# Patient Record
Sex: Female | Born: 1965 | Race: White | Hispanic: No | Marital: Married | Smoking: Never smoker
Health system: Southern US, Community
[De-identification: ages and names within clinical notes are randomized; demographics above are authoritative.]

## PROBLEM LIST (undated history)

## (undated) DIAGNOSIS — K635 Polyp of colon: Secondary | ICD-10-CM

## (undated) DIAGNOSIS — F329 Major depressive disorder, single episode, unspecified: Secondary | ICD-10-CM

## (undated) DIAGNOSIS — K279 Peptic ulcer, site unspecified, unspecified as acute or chronic, without hemorrhage or perforation: Secondary | ICD-10-CM

## (undated) DIAGNOSIS — F32A Depression, unspecified: Secondary | ICD-10-CM

## (undated) HISTORY — DX: Major depressive disorder, single episode, unspecified: F32.9

## (undated) HISTORY — PX: COLONOSCOPY WITH ESOPHAGOGASTRODUODENOSCOPY (EGD): SHX5779

## (undated) HISTORY — DX: Depression, unspecified: F32.A

## (undated) HISTORY — DX: Peptic ulcer, site unspecified, unspecified as acute or chronic, without hemorrhage or perforation: K27.9

---

## 1998-03-01 HISTORY — PX: TUBAL LIGATION: SHX77

## 1998-04-07 ENCOUNTER — Ambulatory Visit (HOSPITAL_COMMUNITY): Admission: RE | Admit: 1998-04-07 | Discharge: 1998-04-07 | Payer: Self-pay | Admitting: Obstetrics and Gynecology

## 1998-07-01 ENCOUNTER — Encounter: Admission: RE | Admit: 1998-07-01 | Discharge: 1998-09-29 | Payer: Self-pay | Admitting: Obstetrics and Gynecology

## 1998-07-22 ENCOUNTER — Encounter: Payer: Self-pay | Admitting: Obstetrics and Gynecology

## 1998-07-22 ENCOUNTER — Ambulatory Visit (HOSPITAL_COMMUNITY): Admission: RE | Admit: 1998-07-22 | Discharge: 1998-07-22 | Payer: Self-pay | Admitting: Obstetrics and Gynecology

## 1998-08-12 ENCOUNTER — Encounter (HOSPITAL_COMMUNITY): Admission: RE | Admit: 1998-08-12 | Discharge: 1998-09-01 | Payer: Self-pay | Admitting: Obstetrics and Gynecology

## 1998-08-28 ENCOUNTER — Inpatient Hospital Stay (HOSPITAL_COMMUNITY): Admission: AD | Admit: 1998-08-28 | Discharge: 1998-08-28 | Payer: Self-pay | Admitting: Obstetrics and Gynecology

## 1998-08-31 ENCOUNTER — Inpatient Hospital Stay (HOSPITAL_COMMUNITY): Admission: AD | Admit: 1998-08-31 | Discharge: 1998-09-02 | Payer: Self-pay | Admitting: Obstetrics and Gynecology

## 1998-09-01 ENCOUNTER — Encounter (INDEPENDENT_AMBULATORY_CARE_PROVIDER_SITE_OTHER): Payer: Self-pay | Admitting: *Deleted

## 2000-06-13 ENCOUNTER — Other Ambulatory Visit: Admission: RE | Admit: 2000-06-13 | Discharge: 2000-06-13 | Payer: Self-pay | Admitting: Obstetrics and Gynecology

## 2005-06-01 ENCOUNTER — Encounter: Payer: Self-pay | Admitting: Family Medicine

## 2005-06-01 ENCOUNTER — Other Ambulatory Visit: Admission: RE | Admit: 2005-06-01 | Discharge: 2005-06-01 | Payer: Self-pay | Admitting: Family Medicine

## 2005-06-01 ENCOUNTER — Ambulatory Visit: Payer: Self-pay | Admitting: Family Medicine

## 2007-02-06 ENCOUNTER — Encounter: Payer: Self-pay | Admitting: Family Medicine

## 2007-02-06 ENCOUNTER — Encounter: Payer: Self-pay | Admitting: Gastroenterology

## 2007-02-06 ENCOUNTER — Ambulatory Visit: Payer: Self-pay | Admitting: Gastroenterology

## 2007-02-13 ENCOUNTER — Ambulatory Visit: Payer: Self-pay | Admitting: Gastroenterology

## 2007-03-16 ENCOUNTER — Ambulatory Visit: Payer: Self-pay | Admitting: Gastroenterology

## 2007-03-16 ENCOUNTER — Encounter: Payer: Self-pay | Admitting: Family Medicine

## 2007-04-12 ENCOUNTER — Ambulatory Visit: Payer: Self-pay | Admitting: Internal Medicine

## 2007-04-12 ENCOUNTER — Encounter: Payer: Self-pay | Admitting: Gastroenterology

## 2007-04-12 ENCOUNTER — Encounter: Payer: Self-pay | Admitting: Family Medicine

## 2007-04-26 ENCOUNTER — Ambulatory Visit: Payer: Self-pay | Admitting: Internal Medicine

## 2007-08-17 ENCOUNTER — Telehealth: Payer: Self-pay | Admitting: Internal Medicine

## 2007-10-11 ENCOUNTER — Encounter: Payer: Self-pay | Admitting: Family Medicine

## 2007-10-11 ENCOUNTER — Ambulatory Visit: Payer: Self-pay | Admitting: Family Medicine

## 2007-10-11 ENCOUNTER — Encounter (INDEPENDENT_AMBULATORY_CARE_PROVIDER_SITE_OTHER): Payer: Self-pay | Admitting: *Deleted

## 2007-10-11 ENCOUNTER — Other Ambulatory Visit: Admission: RE | Admit: 2007-10-11 | Discharge: 2007-10-11 | Payer: Self-pay | Admitting: Family Medicine

## 2007-10-11 DIAGNOSIS — K279 Peptic ulcer, site unspecified, unspecified as acute or chronic, without hemorrhage or perforation: Secondary | ICD-10-CM | POA: Insufficient documentation

## 2007-10-16 ENCOUNTER — Encounter (INDEPENDENT_AMBULATORY_CARE_PROVIDER_SITE_OTHER): Payer: Self-pay | Admitting: *Deleted

## 2007-10-17 ENCOUNTER — Ambulatory Visit (HOSPITAL_COMMUNITY): Admission: RE | Admit: 2007-10-17 | Discharge: 2007-10-17 | Payer: Self-pay | Admitting: Family Medicine

## 2007-10-19 ENCOUNTER — Telehealth (INDEPENDENT_AMBULATORY_CARE_PROVIDER_SITE_OTHER): Payer: Self-pay | Admitting: *Deleted

## 2007-10-23 ENCOUNTER — Encounter (INDEPENDENT_AMBULATORY_CARE_PROVIDER_SITE_OTHER): Payer: Self-pay | Admitting: *Deleted

## 2007-10-24 ENCOUNTER — Encounter (INDEPENDENT_AMBULATORY_CARE_PROVIDER_SITE_OTHER): Payer: Self-pay | Admitting: *Deleted

## 2008-01-23 ENCOUNTER — Ambulatory Visit: Payer: Self-pay | Admitting: Family Medicine

## 2008-02-17 LAB — CONVERTED CEMR LAB
ALT: 19 units/L (ref 0–35)
AST: 15 units/L (ref 0–37)
Albumin: 3.6 g/dL (ref 3.5–5.2)
Alkaline Phosphatase: 71 units/L (ref 39–117)
Bilirubin, Direct: 0.1 mg/dL (ref 0.0–0.3)
Cholesterol: 199 mg/dL (ref 0–200)
HDL: 46.8 mg/dL (ref >39.0)
LDL Cholesterol: 134 mg/dL — ABNORMAL HIGH (ref 0–99)
Total Bilirubin: 0.9 mg/dL (ref 0.3–1.2)
Total CHOL/HDL Ratio: 4.3
Total Protein: 7.1 g/dL (ref 6.0–8.3)
Triglycerides: 93 mg/dL (ref 0–149)
VLDL: 19 mg/dL (ref 0–40)

## 2008-02-19 ENCOUNTER — Encounter (INDEPENDENT_AMBULATORY_CARE_PROVIDER_SITE_OTHER): Payer: Self-pay | Admitting: *Deleted

## 2008-04-10 ENCOUNTER — Telehealth: Payer: Self-pay | Admitting: Internal Medicine

## 2008-04-19 ENCOUNTER — Ambulatory Visit: Payer: Self-pay | Admitting: Family Medicine

## 2008-04-19 DIAGNOSIS — F329 Major depressive disorder, single episode, unspecified: Secondary | ICD-10-CM

## 2008-05-17 ENCOUNTER — Ambulatory Visit: Payer: Self-pay | Admitting: Family Medicine

## 2008-06-21 ENCOUNTER — Ambulatory Visit: Payer: Self-pay | Admitting: Family Medicine

## 2008-06-25 ENCOUNTER — Encounter (INDEPENDENT_AMBULATORY_CARE_PROVIDER_SITE_OTHER): Payer: Self-pay | Admitting: *Deleted

## 2008-06-25 LAB — CONVERTED CEMR LAB
ALT: 14 units/L (ref 0–35)
AST: 12 units/L (ref 0–37)
Albumin: 3.6 g/dL (ref 3.5–5.2)
Alkaline Phosphatase: 76 units/L (ref 39–117)
Bilirubin, Direct: 0.1 mg/dL (ref 0.0–0.3)
Cholesterol: 192 mg/dL (ref 0–200)
HDL: 44.4 mg/dL (ref >39.00)
LDL Cholesterol: 132 mg/dL — ABNORMAL HIGH (ref 0–99)
Total Bilirubin: 0.9 mg/dL (ref 0.3–1.2)
Total CHOL/HDL Ratio: 4
Total Protein: 6.9 g/dL (ref 6.0–8.3)
Triglycerides: 79 mg/dL (ref 0.0–149.0)
VLDL: 15.8 mg/dL (ref 0.0–40.0)

## 2008-08-19 ENCOUNTER — Telehealth (INDEPENDENT_AMBULATORY_CARE_PROVIDER_SITE_OTHER): Payer: Self-pay | Admitting: *Deleted

## 2009-05-08 ENCOUNTER — Encounter (INDEPENDENT_AMBULATORY_CARE_PROVIDER_SITE_OTHER): Payer: Self-pay | Admitting: *Deleted

## 2009-05-08 ENCOUNTER — Ambulatory Visit: Payer: Self-pay | Admitting: Family Medicine

## 2009-05-09 ENCOUNTER — Encounter (INDEPENDENT_AMBULATORY_CARE_PROVIDER_SITE_OTHER): Payer: Self-pay | Admitting: *Deleted

## 2009-05-19 ENCOUNTER — Telehealth (INDEPENDENT_AMBULATORY_CARE_PROVIDER_SITE_OTHER): Payer: Self-pay | Admitting: *Deleted

## 2009-05-20 ENCOUNTER — Other Ambulatory Visit: Admission: RE | Admit: 2009-05-20 | Discharge: 2009-05-20 | Payer: Self-pay | Admitting: Family Medicine

## 2009-05-20 ENCOUNTER — Ambulatory Visit: Payer: Self-pay | Admitting: Family Medicine

## 2009-05-20 ENCOUNTER — Ambulatory Visit (HOSPITAL_COMMUNITY): Admission: RE | Admit: 2009-05-20 | Discharge: 2009-05-20 | Payer: Self-pay | Admitting: Family Medicine

## 2009-05-23 ENCOUNTER — Ambulatory Visit: Payer: Self-pay | Admitting: Family Medicine

## 2009-05-27 ENCOUNTER — Encounter (INDEPENDENT_AMBULATORY_CARE_PROVIDER_SITE_OTHER): Payer: Self-pay | Admitting: *Deleted

## 2009-05-27 LAB — CONVERTED CEMR LAB: Fecal Occult Bld: NEGATIVE

## 2009-05-29 ENCOUNTER — Telehealth (INDEPENDENT_AMBULATORY_CARE_PROVIDER_SITE_OTHER): Payer: Self-pay | Admitting: *Deleted

## 2009-05-29 DIAGNOSIS — R87620 Atypical squamous cells of undetermined significance on cytologic smear of vagina (ASC-US): Secondary | ICD-10-CM | POA: Insufficient documentation

## 2009-05-29 LAB — CONVERTED CEMR LAB: Pap Smear: UNDETERMINED

## 2009-06-06 ENCOUNTER — Encounter: Payer: Self-pay | Admitting: Family Medicine

## 2009-07-15 ENCOUNTER — Telehealth (INDEPENDENT_AMBULATORY_CARE_PROVIDER_SITE_OTHER): Payer: Self-pay | Admitting: *Deleted

## 2009-07-25 ENCOUNTER — Telehealth (INDEPENDENT_AMBULATORY_CARE_PROVIDER_SITE_OTHER): Payer: Self-pay | Admitting: *Deleted

## 2009-12-03 ENCOUNTER — Ambulatory Visit: Payer: Self-pay | Admitting: Family Medicine

## 2009-12-03 DIAGNOSIS — R5383 Other fatigue: Secondary | ICD-10-CM

## 2009-12-03 DIAGNOSIS — R5381 Other malaise: Secondary | ICD-10-CM

## 2009-12-03 DIAGNOSIS — D649 Anemia, unspecified: Secondary | ICD-10-CM

## 2009-12-08 LAB — CONVERTED CEMR LAB
ALT: 17 units/L (ref 0–35)
AST: 15 units/L (ref 0–37)
BUN: 13 mg/dL (ref 6–23)
Basophils Relative: 0.7 % (ref 0.0–3.0)
Bilirubin, Direct: 0.1 mg/dL (ref 0.0–0.3)
Creatinine, Ser: 0.8 mg/dL (ref 0.4–1.2)
Eosinophils Relative: 4.3 % (ref 0.0–5.0)
GFR calc non Af Amer: 80.72 mL/min (ref >60)
HCT: 33.8 % — ABNORMAL LOW (ref 36.0–46.0)
Iron: 106 ug/dL (ref 42–145)
Monocytes Relative: 8.7 % (ref 3.0–12.0)
Neutrophils Relative %: 54.9 % (ref 43.0–77.0)
Platelets: 249 10*3/uL (ref 150.0–400.0)
Potassium: 4.5 meq/L (ref 3.5–5.1)
RBC: 3.96 M/uL (ref 3.87–5.11)
Saturation Ratios: 21.3 % (ref 20.0–50.0)
Total Bilirubin: 0.8 mg/dL (ref 0.3–1.2)
Transferrin: 356.2 mg/dL (ref 212.0–360.0)
WBC: 4.9 10*3/uL (ref 4.5–10.5)

## 2010-03-29 LAB — CONVERTED CEMR LAB
ALT: 17 units/L (ref 0–35)
ALT: 19 units/L (ref 0–35)
AST: 14 units/L (ref 0–37)
AST: 15 units/L (ref 0–37)
Albumin: 3.6 g/dL (ref 3.5–5.2)
Albumin: 3.7 g/dL (ref 3.5–5.2)
Alkaline Phosphatase: 70 units/L (ref 39–117)
Alkaline Phosphatase: 73 units/L (ref 39–117)
BUN: 10 mg/dL (ref 6–23)
BUN: 9 mg/dL (ref 6–23)
Basophils Absolute: 0 10*3/uL (ref 0.0–0.1)
Basophils Absolute: 0 10*3/uL (ref 0.0–0.1)
Basophils Relative: 0.8 % (ref 0.0–3.0)
Basophils Relative: 0.9 % (ref 0.0–3.0)
Bilirubin, Direct: 0.1 mg/dL (ref 0.0–0.3)
Bilirubin, Direct: 0.1 mg/dL (ref 0.0–0.3)
CO2: 28 meq/L (ref 19–32)
CO2: 28 meq/L (ref 19–32)
Calcium: 9.1 mg/dL (ref 8.4–10.5)
Calcium: 9.3 mg/dL (ref 8.4–10.5)
Chloride: 105 meq/L (ref 96–112)
Chloride: 110 meq/L (ref 96–112)
Cholesterol: 141 mg/dL (ref 0–200)
Cholesterol: 203 mg/dL (ref 0–200)
Creatinine, Ser: 0.8 mg/dL (ref 0.4–1.2)
Creatinine, Ser: 0.8 mg/dL (ref 0.4–1.2)
Direct LDL: 141.2 mg/dL
Eosinophils Absolute: 0.1 10*3/uL (ref 0.0–0.7)
Eosinophils Absolute: 0.2 10*3/uL (ref 0.0–0.7)
Eosinophils Relative: 2.5 % (ref 0.0–5.0)
Eosinophils Relative: 5 % (ref 0.0–5.0)
GFR calc Af Amer: 100 mL/min
GFR calc non Af Amer: 82.09 mL/min (ref >60)
GFR calc non Af Amer: 83 mL/min
Glucose, Bld: 90 mg/dL (ref 70–99)
Glucose, Bld: 90 mg/dL (ref 70–99)
HCT: 32.3 % — ABNORMAL LOW (ref 36.0–46.0)
HCT: 33 % — ABNORMAL LOW (ref 36.0–46.0)
HDL: 38.4 mg/dL — ABNORMAL LOW (ref >39.0)
HDL: 42.8 mg/dL (ref >39.00)
Hemoglobin: 10.7 g/dL — ABNORMAL LOW (ref 12.0–15.0)
Hemoglobin: 11.7 g/dL — ABNORMAL LOW (ref 12.0–15.0)
LDL Cholesterol: 84 mg/dL (ref 0–99)
Lymphocytes Relative: 23.7 % (ref 12.0–46.0)
Lymphocytes Relative: 30.1 % (ref 12.0–46.0)
Lymphs Abs: 1.3 10*3/uL (ref 0.7–4.0)
MCHC: 33 g/dL (ref 30.0–36.0)
MCHC: 35.5 g/dL (ref 30.0–36.0)
MCV: 83 fL (ref 78.0–100.0)
MCV: 86.7 fL (ref 78.0–100.0)
Monocytes Absolute: 0.3 10*3/uL (ref 0.1–1.0)
Monocytes Absolute: 0.4 10*3/uL (ref 0.1–1.0)
Monocytes Relative: 6.7 % (ref 3.0–12.0)
Monocytes Relative: 6.8 % (ref 3.0–12.0)
Neutro Abs: 2.8 10*3/uL (ref 1.4–7.7)
Neutro Abs: 3.5 10*3/uL (ref 1.4–7.7)
Neutrophils Relative %: 57.2 % (ref 43.0–77.0)
Neutrophils Relative %: 66.3 % (ref 43.0–77.0)
Platelets: 220 10*3/uL (ref 150–400)
Platelets: 247 10*3/uL (ref 150.0–400.0)
Potassium: 3.8 meq/L (ref 3.5–5.1)
Potassium: 4.1 meq/L (ref 3.5–5.1)
RBC: 3.81 M/uL — ABNORMAL LOW (ref 3.87–5.11)
RBC: 3.89 M/uL (ref 3.87–5.11)
RDW: 12 % (ref 11.5–14.6)
RDW: 14.5 % (ref 11.5–14.6)
Sodium: 139 meq/L (ref 135–145)
Sodium: 142 meq/L (ref 135–145)
TSH: 0.95 microintl units/mL (ref 0.35–5.50)
TSH: 1.06 microintl units/mL (ref 0.35–5.50)
Total Bilirubin: 0.7 mg/dL (ref 0.3–1.2)
Total Bilirubin: 1 mg/dL (ref 0.3–1.2)
Total CHOL/HDL Ratio: 3
Total CHOL/HDL Ratio: 5.3
Total Protein: 6.6 g/dL (ref 6.0–8.3)
Total Protein: 6.9 g/dL (ref 6.0–8.3)
Triglycerides: 72 mg/dL (ref 0.0–149.0)
Triglycerides: 89 mg/dL (ref 0–149)
VLDL: 14.4 mg/dL (ref 0.0–40.0)
VLDL: 18 mg/dL (ref 0–40)
WBC: 4.7 10*3/uL (ref 4.5–10.5)
WBC: 5.3 10*3/uL (ref 4.5–10.5)

## 2010-03-31 NOTE — Assessment & Plan Note (Signed)
Summary: pap only//lch   Vital Signs:  Patient profile:   45 year old female Weight:      181 pounds Pulse rate:   72 / minute Pulse rhythm:   regular BP sitting:   122 / 80  (left arm) Cuff size:   regular  Vitals Entered By: Army Fossa CMA (May 20, 2009 9:54 AM) CC: Pt here for PAP only   Preventive Screening-Counseling & Management  Alcohol-Tobacco     Alcohol drinks/day: <1     Alcohol type: rum     Smoking Status: never  Caffeine-Diet-Exercise     Caffeine use/day: 5+     Caffeine Counseling: decrease use of caffeine     Does Patient Exercise: no     Exercise Counseling: to improve exercise regimen  Current Medications (verified): 1)  Protonix 40 Mg  Tbec (Pantoprazole Sodium) .Marland Kitchen.. 1 Each Day 30 Minutes Before Meal 2)  Wellbutrin Xl 150 Mg Xr24h-Tab (Bupropion Hcl) .... Take 3 By Mouth Once Daily 3)  Slow Fe 160 (50 Fe) Mg Cr-Tabs (Ferrous Sulfate Dried)  Allergies: 1)  ! * Nexium  Past History:  Past medical, surgical, family and social histories (including risk factors) reviewed for relevance to current acute and chronic problems.  Past Medical History: Reviewed history from 04/19/2008 and no changes required. Peptic ulcer disease Depression  Past Surgical History: Reviewed history from 05/08/2009 and no changes required. Tubal ligation (2000)  Family History: Reviewed history from 10/11/2007 and no changes required. MGM--breast Cancer in 39s M--bladder Cancer, pulm fibrosis Family History High cholesterol Family History Hypertension Family History Kidney disease Family History Thyroid disease  Social History: Reviewed history from 05/08/2009 and no changes required. 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 Normal sphincter tone. No rectal masses or tenderness.  heme neg brown stool Genitalia:  Pelvic Exam:        External: normal female genitalia without lesions or masses        Vagina: normal without lesions or masses        Cervix: normal without lesions or masses        Adnexa: normal bimanual exam without masses or fullness        Uterus: normal by palpation        Pap smear: performed Psych:  Oriented X3, normally interactive, and good eye contact.     Impression & Recommendations:  Problem # 1:  ROUTINE GYNECOLOGICAL EXAMINATION (ICD-V72.31) pap done ghm utd rto 1 year  Complete Medication List: 1)  Protonix 40 Mg Tbec (Pantoprazole sodium) .Marland Kitchen.. 1 each day 30 minutes before meal 2)  Wellbutrin Xl 150 Mg Xr24h-tab (Bupropion hcl) .... Take 3 by mouth once daily 3)  Slow Fe 160 (50 Fe) Mg Cr-tabs (Ferrous sulfate dried)

## 2010-03-31 NOTE — Progress Notes (Signed)
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 Army Fossa CMA  May 29, 2009 10:17 AM

## 2010-03-31 NOTE — Consult Note (Signed)
Summary: [facility]  [facility]   Imported By: Lanelle Bal 06/13/2009 09:07:13  _____________________________________________________________________  External Attachment:    Type:   Image     Comment:   External Document

## 2010-03-31 NOTE — Progress Notes (Signed)
Summary: Refill Request  Phone Note Refill Request Message from:  Pharmacy on Jul 15, 2009 9:48 AM  Refills Requested: Medication #1:  PROTONIX 40 MG  TBEC 1 each day 30 minutes before meal   Dosage confirmed as above?Dosage Confirmed   Supply Requested: 1 month [facility]  Next Appointment Scheduled: none Initial call taken by: Harold Barban,  Jul 15, 2009 9:48 AM    Prescriptions: PROTONIX 40 MG  TBEC (PANTOPRAZOLE SODIUM) 1 each day 30 minutes before meal  #30 x 5   Entered by:   Army Fossa CMA   Authorized by:   Loreen Freud DO   Signed by:   Army Fossa CMA on 07/15/2009   Method used:   Electronically to        [facility]* (retail)       [address]       [address] Shipping/mailing       [place], [place]  [address]       Ph: [phone]       Fax: [phone]   RxID:   [id number]

## 2010-03-31 NOTE — Letter (Signed)
Summary: [facility]: Immunoassay Fecal Occult Blood (iFOB) Order Form  [facility]  [address] [place], [place] [address]   Phone: [phone]  Fax: [phone]      [facility]: Immunoassay Fecal Occult Blood (iFOB) Order Form   May 08, 2009 MRN: [id number]   Sandra Barnett March 07, 1965   Physicican Name:___Yvonne Lowne,DO______________________  Diagnosis Code:______V76.51____________________      Army Fossa CMA

## 2010-03-31 NOTE — Letter (Signed)
Summary: Results Follow up Letter  [facility]  [address] [place], [place] [address]   Phone: [phone]  Fax: [phone]    05/27/2009 MRN: [id number]  Sandra Barnett [address] [place], [place]  [address]  Dear [name],  The following are the results of your recent test(s):  Test         Result    Pap Smear:        Normal _____  Not Normal _____ Comments: ______________________________________________________ Cholesterol: LDL(Bad cholesterol):         Your goal is less than:         HDL (Good cholesterol):       Your goal is more than: Comments:  ______________________________________________________ Mammogram:        Normal _____  Not Normal _____ Comments:  ___________________________________________________________________ Hemoccult:        Normal __X___  Not normal _______ Comments:    _____________________________________________________________________ Other Tests:    We routinely do not discuss normal results over the telephone.  If you desire a copy of the results, or you have any questions about this information we can discuss them at your next office visit.   Sincerely,

## 2010-03-31 NOTE — Letter (Signed)
Summary: Primary Care Consult Scheduled Letter  [facility]  [address] [place], [place] [address]   Phone: [phone]  Fax: [phone]      05/09/2009 MRN: [id number]  BAYLE CALVO [address] [place], [place]  [address]    Dear [name],    We have scheduled an appointment for you.  At the recommendation of Dr. Loreen Freud, we have scheduled you for a Screening Mammogram with [facility] on 05-20-2009 arrive by 8:00am.  Their address is [address] [place] [address]. The office phone number is [phone].  If this appointment day and time is not convenient for you, please feel free to call the office of the doctor you are being referred to at the number listed above and reschedule the appointment.    It is important for you to keep your scheduled appointments. We are here to make sure you are given good patient care.   Thank you,    Renee, Patient Care Coordinator [facility]

## 2010-03-31 NOTE — Progress Notes (Signed)
Summary: date start med  Phone Note Refill Request   Refills Requested: Medication #1:  WELLBUTRIN XL 150 MG XR24H-TAB Take 3 by mouth once daily pt left VM that she needs to know the date she started this med. called pt back informed pt of date  Initial call taken by: [facility] CMA,  Jul 25, 2009 2:05 PM

## 2010-03-31 NOTE — Assessment & Plan Note (Signed)
Summary: THYROID PROBLEM??/KN   Vital Signs:  Patient profile:   45 year old female Weight:      187.2 pounds Temp:     98.3 degrees F oral Pulse rate:   64 / minute Pulse rhythm:   regular BP sitting:   130 / 82  (left arm) Cuff size:   regular  Vitals Entered By: Almeta Monas CMA Duncan Dull) (December 03, 2009 10:35 AM) CC: c/o hair loss and feeling tired all of the time, wants to check thyroid   History of Present Illness: Pt here c/o hair loss for 6-8 months.  Clumps come out in shower. Pt is tired all the time.  Pt is requesting tsh to be checked.  Pt does not sleep great.  She states she is not depressed and has been doing well off wellbutrin.  Pt states her cycles are heavier and longer.  Pt states it takes every oz in her to get up and go to gym---she is exhausted.     Preventive Screening-Counseling & Management  Alcohol-Tobacco     Alcohol drinks/day: <1     Alcohol type: rum     Smoking Status: never  Caffeine-Diet-Exercise     Caffeine use/day: 5+     Caffeine Counseling: decrease use of caffeine     Does Patient Exercise: no     Exercise Counseling: to improve exercise regimen  Current Medications (verified): 1)  Protonix 40 Mg  Tbec (Pantoprazole Sodium) .Marland Kitchen.. 1 Each Day 30 Minutes Before Meal  Allergies (verified): 1)  ! * Nexium  Past History:  Past medical, surgical, family and social histories (including risk factors) reviewed for relevance to current acute and chronic problems.  Past Medical History: Reviewed history from 04/19/2008 and no changes required. Peptic ulcer disease Depression  Past Surgical History: Reviewed history from 05/08/2009 and no changes required. Tubal ligation (2000)  Family History: Reviewed history from 10/11/2007 and no changes required. MGM--breast Cancer in 18s M--bladder Cancer, pulm fibrosis Family History High cholesterol Family History Hypertension Family History Kidney disease Family History Thyroid  disease  Social History: Reviewed history from 05/08/2009 and no changes required. 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  Teeth in good repair. Neck:  No deformities, masses, or tenderness noted. Lungs:  Normal respiratory effort, chest expands symmetrically. Lungs are clear to auscultation, no crackles or wheezes. Heart:  Normal rate and regular rhythm. S1 and S2 normal without gallop, murmur, click, rub or other extra sounds. Extremities:  No clubbing, cyanosis, edema, or deformity noted with normal full range of motion of all joints.   Skin:  Intact without suspicious lesions or rashes Cervical Nodes:  No lymphadenopathy noted Psych:  Cognition and judgment appear intact. Alert and cooperative with normal attention span and concentration. No apparent delusions, illusions, hallucinations   Impression & Recommendations:  Problem # 1:  FATIGUE (ICD-780.79)  Orders: Venipuncture ([address]) TLB-B12 + Folate Pnl (60454_09811-B14/NWG) TLB-IBC Pnl (Iron/FE;Transferrin) (83550-IBC) TLB-BMP (Basic Metabolic Panel-BMET) (80048-METABOL) TLB-CBC Platelet - w/Differential (85025-CBCD) TLB-Hepatic/Liver Function Pnl (80076-HEPATIC) TLB-TSH (Thyroid Stimulating Hormone) (84443-TSH) TLB-T3, Free (Triiodothyronine) (84481-T3FREE) TLB-T4 (Thyrox), Free [phone])  Problem # 2:  ANEMIA (ICD-285.9)  The following medications were removed from the medication list:    Slow Fe 160 (50 Fe) Mg  Cr-tabs (Ferrous sulfate dried)  Orders: Venipuncture ([address]) TLB-B12 + Folate Pnl (78295_62130-Q65/HQI) TLB-IBC Pnl (Iron/FE;Transferrin) (83550-IBC) TLB-BMP (Basic Metabolic Panel-BMET)  (80048-METABOL) TLB-CBC Platelet - w/Differential (85025-CBCD) TLB-Hepatic/Liver Function Pnl (80076-HEPATIC) TLB-TSH (Thyroid Stimulating Hormone) (84443-TSH) TLB-T3, Free (Triiodothyronine) (84481-T3FREE) TLB-T4 (Thyrox), Free [phone])  Hgb: 10.7 (05/08/2009)   Hct: 32.3 (05/08/2009)   Platelets: 247.0 (05/08/2009) RBC: 3.89 (05/08/2009)   RDW: 14.5 (05/08/2009)   WBC: 5.3 (05/08/2009) MCV: 83.0 (05/08/2009)   MCHC: 33.0 (05/08/2009) TSH: 1.06 (05/08/2009)  Complete Medication List: 1)  Protonix 40 Mg Tbec (Pantoprazole sodium) .Marland Kitchen.. 1 each day 30 minutes before meal

## 2010-03-31 NOTE — Progress Notes (Signed)
  Phone Note Outgoing Call   Call placed by: Army Fossa CMA,  May 19, 2009 4:39 PM Reason for Call: Discuss lab or test results Summary of Call: Regarding lab results, LMTCB:  Hgb low----take slow fe 1 by mouth once daily  recheck 1 month 285.9  cbcd, ibc panal,  ferritin Signed by Loreen Freud DO on 05/18/2009 at 11:49 AM  Follow-up for Phone Call        Pt is aware. Army Fossa CMA  May 19, 2009 4:46 PM     New/Updated Medications: SLOW FE 160 (50 FE) MG CR-TABS (FERROUS SULFATE DRIED)

## 2010-03-31 NOTE — Assessment & Plan Note (Signed)
Summary: CPX---WILL TRY TO FAST AFTER 6AM///SPH   Vital Signs:  Patient profile:   45 year old female Height:      64 inches Weight:      176 pounds BMI:     30.32 Temp:     98.2 degrees F oral Pulse rate:   68 / minute BP sitting:   118 / 86  (left arm)  Vitals Entered By: Jeremy Johann CMA (May 08, 2009 12:44 PM) CC: CPX. FASTING, NO PAP Comments REVIEWED MED LIST, PATIENT AGREED DOSE AND INSTRUCTION CORRECT    History of Present Illness: Pt here for cpe--pt is on menses so she will rto for pap.  Labs will be done as well.   No complaints.    Preventive Screening-Counseling & Management  Alcohol-Tobacco     Alcohol drinks/day: <1     Smoking Status: never  Caffeine-Diet-Exercise     Caffeine use/day: 5+     Caffeine Counseling: decrease use of caffeine     Does Patient Exercise: no     Exercise Counseling: to improve exercise regimen  Hep-HIV-STD-Contraception     HIV Risk: no     Dental Visit-last 6 months yes     Dental Care Counseling: not indicated; dental care within six months     SBE monthly: no     SBE Education/Counseling: to perform regular SBE  Safety-Violence-Falls     Seat Belt Use: 100      Sexual History:  currently monogamous and separated.    Current Medications (verified): 1)  Protonix 40 Mg  Tbec (Pantoprazole Sodium) .Marland Kitchen.. 1 Each Day 30 Minutes Before Meal 2)  Wellbutrin Xl 150 Mg Xr24h-Tab (Bupropion Hcl) .... Take 3 By Mouth Once Daily  Allergies: 1)  ! 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 Eyes:  Denies blurring, discharge, double vision, eye irritation, eye pain, halos, itching, light sensitivity, red eye, vision loss-1 eye, and vision loss-both eyes; optho-- q1y. ENT:  Denies decreased hearing, difficulty swallowing, ear discharge, earache, hoarseness, nasal congestion, nosebleeds, postnasal drainage, ringing in ears, sinus pressure, and sore throat. CV:  Denies bluish discoloration of lips or nails, chest pain or discomfort, difficulty breathing at night, difficulty breathing while lying down, fainting, fatigue, leg cramps with exertion, lightheadness, near fainting, palpitations, shortness of breath with exertion, swelling of feet, swelling of hands, and weight gain. Resp:  Denies chest discomfort, chest pain with inspiration, cough, coughing up blood, excessive snoring, hypersomnolence, morning headaches, pleuritic, shortness of breath, sputum productive, and wheezing. GI:  Denies abdominal pain, bloody stools, change in bowel habits, constipation, dark tarry stools, diarrhea, excessive appetite, gas, hemorrhoids, indigestion, and loss of appetite.  GU:  Denies abnormal vaginal bleeding, decreased libido, discharge, dysuria, genital sores, hematuria, incontinence, nocturia, urinary frequency, and urinary hesitancy. MS:  Denies joint pain, joint redness, joint swelling, loss of strength, low back pain, mid back pain,  muscle aches, muscle , cramps, muscle weakness, stiffness, and thoracic pain. Derm:  Denies changes in color of skin, changes in nail beds, dryness, excessive perspiration, flushing, hair loss, insect bite(s), itching, lesion(s), poor wound healing, and rash. Neuro:  Denies brief paralysis, difficulty with concentration, disturbances in coordination, falling down, headaches, inability to speak, memory loss, numbness, poor balance, seizures, sensation of room spinning, tingling, tremors, visual disturbances, and weakness. Psych:  Denies alternate hallucination ( auditory/visual), anxiety, depression, easily angered, easily tearful, irritability, mental problems, panic attacks, sense of great danger, suicidal thoughts/plans, thoughts of violence, unusual visions or sounds, and thoughts /plans of harming others. Endo:  Denies cold intolerance, excessive hunger, excessive thirst, excessive urination, heat intolerance, polyuria, and weight change. Heme:  Denies abnormal bruising, bleeding, enlarge lymph nodes, fevers, pallor, and skin discoloration. Allergy:  Denies hives or rash, itching eyes, persistent infections, seasonal allergies, and sneezing.  Physical Exam  General:  Well-developed,well-nourished,in no acute distress; alert,appropriate and cooperative throughout examination Head:  Normocephalic and atraumatic without obvious abnormalities. No apparent alopecia or balding. Eyes:  vision grossly intact, pupils equal, pupils round, pupils reactive to light, and no injection.   Ears:  External ear exam shows no significant lesions or deformities.  Otoscopic examination reveals clear canals, tympanic membranes are intact bilaterally without bulging, retraction, inflammation or discharge. Hearing is grossly normal bilaterally. Nose:  External nasal examination shows no deformity or inflammation. Nasal mucosa are pink and moist without lesions or exudates. Mouth:  Oral mucosa and oropharynx without  lesions or exudates.  Teeth in good repair. Neck:  No deformities, masses, or tenderness noted. Chest Wall:  No deformities, masses, or tenderness noted. Breasts:  No mass, nodules, thickening, tenderness, bulging, retraction, inflamation, nipple discharge or skin changes noted.   Lungs:  Normal respiratory effort, chest expands symmetrically. Lungs are clear to auscultation, no crackles or wheezes. Heart:  normal rate and no murmur.   Abdomen:  Bowel sounds positive,abdomen soft and non-tender without masses, organomegaly or hernias noted. Genitalia:  deferred Msk:  normal ROM, no joint tenderness, no joint swelling, no joint warmth, no redness over joints, no joint deformities, no joint instability, and no crepitation.   Pulses:  R posterior tibial normal, R dorsalis pedis normal, R carotid normal, L posterior tibial normal, L dorsalis pedis normal, and L carotid normal.   Extremities:  No clubbing, cyanosis, edema, or deformity noted with normal full range of motion of all joints.   Neurologic:  No cranial nerve deficits noted. Station and gait are normal. Plantar reflexes are down-going bilaterally. DTRs are symmetrical throughout. Sensory, motor and coordinative functions appear intact. Skin:  Intact without suspicious lesions or rashes Cervical Nodes:  No lymphadenopathy noted Axillary Nodes:  No palpable lymphadenopathy Psych:  Cognition and judgment appear intact. Alert and cooperative with normal attention span and concentration. No apparent delusions, illusions, hallucinations   Impression & Recommendations:  Problem # 1:  PREVENTIVE HEALTH CARE (ICD-V70.0) GHM utd sbe  check mammo rto for pap Orders: Radiology Referral (Radiology) Venipuncture ([address]) TLB-Lipid Panel (80061-LIPID) TLB-BMP (Basic Metabolic Panel-BMET) (80048-METABOL) TLB-CBC Platelet - w/Differential (85025-CBCD) TLB-Hepatic/Liver Function Pnl (80076-HEPATIC) TLB-TSH (Thyroid Stimulating Hormone)  (84443-TSH)  Problem # 2:  DEPRESSION (ICD-311)  Her updated medication list for this problem includes:  Wellbutrin Xl 150 Mg Xr24h-tab (Bupropion hcl) .Marland Kitchen... Take 3 by mouth once daily  Orders: Venipuncture ([address]) TLB-Lipid Panel (80061-LIPID) TLB-BMP (Basic Metabolic Panel-BMET) (80048-METABOL) TLB-CBC Platelet - w/Differential (85025-CBCD) TLB-Hepatic/Liver Function Pnl (80076-HEPATIC) TLB-TSH (Thyroid Stimulating Hormone) (84443-TSH)  Complete Medication List: 1)  Protonix 40 Mg Tbec (Pantoprazole sodium) .Marland Kitchen.. 1 each day 30 minutes before meal 2)  Wellbutrin Xl 150 Mg Xr24h-tab (Bupropion hcl) .... Take 3 by mouth once daily  Patient Instructions: 1)  RTO pap only---- 2 weeks Prescriptions: WELLBUTRIN XL 150 MG XR24H-TAB (BUPROPION HCL) Take 3 by mouth once daily  #270 x 3   Entered and Authorized by:   Loreen Freud DO   Signed by:   Loreen Freud DO on 05/08/2009   Method used:   Electronically to        [facility]* (retail)       [address]       [address] Shipping/mailing       [place], [place]  [address]       Ph: [phone]       Fax: [phone]   RxID:   [id number]    EKG  Procedure date:  05/08/2009  Findings:      Normal sinus rhythm with rate of:  73 bpm   Flu Vaccine Result Date:  12/11/2008 Flu Vaccine Result:  given  Appended Document: CPX---WILL TRY TO FAST AFTER 6AM///SPH  Laboratory Results   Urine Tests   Date/Time Reported: May 08, 2009 1:35 PM   Routine Urinalysis   Color: yellow Appearance: Clear Glucose: negative   (Normal Range: Negative) Bilirubin: negative   (Normal Range: Negative) Ketone: negative   (Normal Range: Negative) Spec. Gravity: 1.010   (Normal Range: 1.003-1.035) Blood: large   (Normal Range: Negative) pH: 5.0   (Normal Range: 5.0-8.0) Protein: negative   (Normal Range: Negative) Urobilinogen: negative   (Normal Range: 0-1) Nitrite: negative   (Normal Range:  Negative) Leukocyte Esterace: negative   (Normal Range: Negative)    Comments: Floydene Flock  May 08, 2009 1:35 PM pt. w/menses...no cx sent

## 2010-06-09 ENCOUNTER — Other Ambulatory Visit: Payer: Self-pay | Admitting: Family Medicine

## 2010-06-09 DIAGNOSIS — Z1231 Encounter for screening mammogram for malignant neoplasm of breast: Secondary | ICD-10-CM

## 2010-06-17 ENCOUNTER — Ambulatory Visit (HOSPITAL_COMMUNITY)
Admission: RE | Admit: 2010-06-17 | Discharge: 2010-06-17 | Disposition: A | Discharge status: Home / Self Care | Payer: 59 | Source: Ambulatory Visit | Attending: Family Medicine | Admitting: Family Medicine

## 2010-06-17 DIAGNOSIS — Z1231 Encounter for screening mammogram for malignant neoplasm of breast: Secondary | ICD-10-CM | POA: Insufficient documentation

## 2010-06-18 ENCOUNTER — Other Ambulatory Visit: Payer: Self-pay

## 2010-06-18 MED ORDER — PANTOPRAZOLE SODIUM 40 MG PO TBEC: 40.0000 mg | DELAYED_RELEASE_TABLET | Freq: Every day | ORAL | Status: DC

## 2010-10-07 ENCOUNTER — Other Ambulatory Visit: Payer: Self-pay | Admitting: Family Medicine

## 2011-01-06 ENCOUNTER — Encounter: Payer: Self-pay | Admitting: Family Medicine

## 2011-01-07 ENCOUNTER — Ambulatory Visit (INDEPENDENT_AMBULATORY_CARE_PROVIDER_SITE_OTHER): Payer: 59 | Admitting: Family Medicine

## 2011-01-07 ENCOUNTER — Encounter: Payer: Self-pay | Admitting: Family Medicine

## 2011-01-07 VITALS — BP 98/66 | HR 70 | Temp 97.9°F | Resp 20 | Ht 64.0 in | Wt 185.0 lb

## 2011-01-07 DIAGNOSIS — K219 Gastro-esophageal reflux disease without esophagitis: Secondary | ICD-10-CM

## 2011-01-07 DIAGNOSIS — Z0000 Encounter for general adult medical examination without abnormal findings: Secondary | ICD-10-CM

## 2011-01-07 LAB — BASIC METABOLIC PANEL
BUN: 10 mg/dL (ref 6–23)
Calcium: 9 mg/dL (ref 8.4–10.5)
GFR: 101.75 mL/min (ref >60.00)
Potassium: 4.1 mEq/L (ref 3.5–5.1)
Sodium: 139 mEq/L (ref 135–145)

## 2011-01-07 LAB — CBC WITH DIFFERENTIAL/PLATELET
Basophils Absolute: 0 10*3/uL (ref 0.0–0.1)
Eosinophils Relative: 2.9 % (ref 0.0–5.0)
HCT: 34.5 % — ABNORMAL LOW (ref 36.0–46.0)
Hemoglobin: 11.7 g/dL — ABNORMAL LOW (ref 12.0–15.0)
Lymphocytes Relative: 29.6 % (ref 12.0–46.0)
Lymphs Abs: 2 10*3/uL (ref 0.7–4.0)
Monocytes Relative: 7.1 % (ref 3.0–12.0)
Platelets: 238 10*3/uL (ref 150.0–400.0)
RDW: 12.8 % (ref 11.5–14.6)
WBC: 6.9 10*3/uL (ref 4.5–10.5)

## 2011-01-07 LAB — LIPID PANEL
Cholesterol: 212 mg/dL — ABNORMAL HIGH (ref 0–200)
Total CHOL/HDL Ratio: 4
VLDL: 23.2 mg/dL (ref 0.0–40.0)

## 2011-01-07 LAB — HEPATIC FUNCTION PANEL
ALT: 22 U/L (ref 0–35)
AST: 18 U/L (ref 0–37)
Alkaline Phosphatase: 77 U/L (ref 39–117)
Total Bilirubin: 1.1 mg/dL (ref 0.3–1.2)

## 2011-01-07 LAB — TSH: TSH: 0.72 u[IU]/mL (ref 0.35–5.50)

## 2011-01-07 LAB — LDL CHOLESTEROL, DIRECT: Direct LDL: 160.2 mg/dL

## 2011-01-07 MED ORDER — PANTOPRAZOLE SODIUM 40 MG PO TBEC: DELAYED_RELEASE_TABLET | ORAL | Status: AC

## 2011-01-07 NOTE — Progress Notes (Signed)
  Subjective:     Sandra Barnett is a 45 y.o. female and is here for a comprehensive physical exam. The patient reports no problems.  History   Social History  . Marital Status: Married    Spouse Name: N/A    Number of Children: N/A  . Years of Education: N/A   Occupational History  . Not on file.   Social History Main Topics  . Smoking status: Never Smoker   . Smokeless tobacco: Not on file  . Alcohol Use: Yes  . Drug Use: No  . Sexually Active: Yes -- Female partner(s)   Other Topics Concern  . Not on file   Social History Narrative   No exercise   Health Maintenance  Topic Date Due  . Mammogram  06/17/2011  . Influenza Vaccine  11/30/2011  . Pap Smear  02/05/2013  . Tetanus/tdap  10/14/2015    The following portions of the patient's history were reviewed and updated as appropriate: allergies, current medications, past family history, past medical history, past social history, past surgical history and problem list.  Review of Systems Review of Systems  Constitutional: Negative for activity change, appetite change and fatigue.  HENT: Negative for hearing loss, congestion, tinnitus and ear discharge.  dentist q22m Eyes: Negative for visual disturbance (see optho q1y -- vision corrected to 20/20 with glasses).  Respiratory: Negative for cough, chest tightness and shortness of breath.   Cardiovascular: Negative for chest pain, palpitations and leg swelling.  Gastrointestinal: Negative for abdominal pain, diarrhea, constipation and abdominal distention.  Genitourinary: Negative for urgency, frequency, decreased urine volume and difficulty urinating.  Musculoskeletal: Negative for back pain, arthralgias and gait problem.  Skin: Negative for color change, pallor and rash.  Neurological: Negative for dizziness, light-headedness, numbness and headaches.  Hematological: Negative for adenopathy. Does not bruise/bleed easily.  Psychiatric/Behavioral: Negative for suicidal  ideas, confusion, sleep disturbance, self-injury, dysphoric mood, decreased concentration and agitation.       Objective:    BP 98/66  Pulse 70  Temp(Src) 97.9 F (36.6 C) (Oral)  Resp 20  Ht 5\' 4"  (1.626 m)  Wt 185 lb (83.915 kg)  BMI 31.76 kg/m2  SpO2 98% General appearance: alert, cooperative, appears stated age and no distress Head: Normocephalic, without obvious abnormality, atraumatic Eyes: conjunctivae/corneas clear. PERRL, EOM's intact. Fundi benign. Ears: normal TM's and external ear canals both ears Nose: Nares normal. Septum midline. Mucosa normal. No drainage or sinus tenderness. Throat: lips, mucosa, and tongue normal; teeth and gums normal Neck: no adenopathy, no carotid bruit, no JVD, supple, symmetrical, trachea midline and thyroid not enlarged, symmetric, no tenderness/mass/nodules Back: symmetric, no curvature. ROM normal. No CVA tenderness. Lungs: clear to auscultation bilaterally Breasts: gyn Heart: regular rate and rhythm, S1, S2 normal, no murmur, click, rub or gallop Abdomen: soft, non-tender; bowel sounds normal; no masses,  no organomegaly Pelvic: gyn Extremities: extremities normal, atraumatic, no cyanosis or edema Pulses: 2+ and symmetric Skin: Skin color, texture, turgor normal. No rashes or lesions Lymph nodes: Cervical, supraclavicular, and axillary nodes normal. Neurologic: Alert and oriented X 3, normal strength and tone. Normal symmetric reflexes. Normal coordination and gait Psych-- no anxiety or depression   Assessment:    Healthy female exam.    gerd--refill protonix Plan:    fasting labs ghm utd F/u GI ---gerd study See After Visit Summary for Counseling Recommendations

## 2011-01-07 NOTE — Patient Instructions (Signed)

## 2017-08-16 ENCOUNTER — Encounter: Payer: Self-pay | Admitting: Obstetrics and Gynecology

## 2017-08-16 ENCOUNTER — Ambulatory Visit (INDEPENDENT_AMBULATORY_CARE_PROVIDER_SITE_OTHER): Payer: BC Managed Care – PPO | Admitting: Obstetrics and Gynecology

## 2017-08-16 ENCOUNTER — Other Ambulatory Visit: Payer: Self-pay

## 2017-08-16 VITALS — BP 118/74 | HR 72 | Ht 64.0 in | Wt 185.0 lb

## 2017-08-16 DIAGNOSIS — Z1211 Encounter for screening for malignant neoplasm of colon: Secondary | ICD-10-CM

## 2017-08-16 DIAGNOSIS — Z01419 Encounter for gynecological examination (general) (routine) without abnormal findings: Secondary | ICD-10-CM

## 2017-08-16 DIAGNOSIS — R928 Other abnormal and inconclusive findings on diagnostic imaging of breast: Secondary | ICD-10-CM

## 2017-08-16 DIAGNOSIS — Z1239 Encounter for other screening for malignant neoplasm of breast: Secondary | ICD-10-CM

## 2017-08-16 DIAGNOSIS — Z1231 Encounter for screening mammogram for malignant neoplasm of breast: Secondary | ICD-10-CM | POA: Diagnosis not present

## 2017-08-16 DIAGNOSIS — N924 Excessive bleeding in the premenopausal period: Secondary | ICD-10-CM

## 2017-08-16 NOTE — Progress Notes (Addendum)
Gynecology Annual Exam  PCP: Zola Button, Grayling Congress, DO  Chief Complaint:  Chief Complaint  Patient presents with  . Gynecologic Exam    History of Present Illness:Patient is a 52 y.o. [id number] presents for annual exam. The patient has no complaints today.   LMP: Patient's last menstrual period was 08/11/2017 (exact date). Has noted both lengthening and shortening of menstrual cycles.  Mild vasomotor symptoms.  Mom and older sister early 61's at menopause.  No intermenstrual spotting noted, no postcoital bleeding.  She has a remote history of prior abnormal pap smear and colposcopy with normal findings (records reviewed), with her most recent pap smear having been normal.   She has not history of prior pelvic surgery other than BTL.  No prior ultrasound evaluation for her bleeding.  No exogenous hormone exposures.    The patient does perform self breast exams.  There is no notable family history of breast or ovarian cancer in her family (maternal grandmother only breast cancer case in family).  The patient wears seatbelts: yes.   The patient has regular exercise: not asked.    The patient denies current symptoms of depression.     Review of Systems: Review of Systems  Constitutional: Negative for chills and fever.  HENT: Negative for congestion.   Respiratory: Negative for cough and shortness of breath.   Cardiovascular: Negative for chest pain and palpitations.  Gastrointestinal: Negative for abdominal pain, constipation, diarrhea, heartburn, nausea and vomiting.  Genitourinary: Negative for dysuria, frequency and urgency.  Skin: Negative for itching and rash.  Neurological: Negative for dizziness and headaches.  Endo/Heme/Allergies: Negative for polydipsia.  Psychiatric/Behavioral: Negative for depression.    Past Medical History:  Past Medical History:  Diagnosis Date  . Depression   . Peptic ulcer disease     Past Surgical History:  Past Surgical History:    Procedure Laterality Date  . TUBAL LIGATION  2000    Gynecologic History:  Patient's last menstrual period was 08/11/2017 (exact date). Last Pap: Results were: 04/14/2016 NIL and HR HPV negative  Last mammogram: 05/24/2016 [facility] BI-RAD 3 with 12 month follow up recommended for right breast asymmetry   Obstetric History: [id number]  Family History:  Family History  Problem Relation Age of Onset  . Cancer Mother        bladder  . Pulmonary fibrosis Mother   . Hypertension Father   . Heart disease Father   . Kidney disease Father   . Cancer Maternal Grandmother        breast  . Hypertension Other   . Hyperlipidemia Other   . Kidney disease Other   . Thyroid disease Other     Social History:  Social History   Socioeconomic History  . Marital status: Married    Spouse name: Not on file  . Number of children: Not on file  . Years of education: Not on file  . Highest education level: Not on file  Occupational History  . Not on file  Social Needs  . Financial resource strain: Not on file  . Food insecurity:    Worry: Not on file    Inability: Not on file  . Transportation needs:    Medical: Not on file    Non-medical: Not on file  Tobacco Use  . Smoking status: Never Smoker  . Smokeless tobacco: Never Used  Substance and Sexual Activity  . Alcohol use: Yes  . Drug use: No  . Sexual activity: Yes  Partners: Male    Birth control/protection: Surgical    Comment: BTL  Lifestyle  . Physical activity:    Days per week: 0 days    Minutes per session: 0 min  . Stress: Not at all  Relationships  . Social connections:    Talks on phone: Not on file    Gets together: Not on file    Attends religious service: Not on file    Active member of club or organization: Not on file    Attends meetings of clubs or organizations: Not on file    Relationship status: Not on file  . Intimate partner violence:    Fear of current or ex partner: Not on file    Emotionally abused:  Not on file    Physically abused: Not on file    Forced sexual activity: Not on file  Other Topics Concern  . Not on file  Social History Narrative   No exercise    Allergies:  Allergies  Allergen Reactions  . Esomeprazole Magnesium     REACTION: itching    Medications: Prior to Admission medications   Medication Sig Start Date End Date Taking? Authorizing Provider  lansoprazole (PREVACID) 15 MG capsule Take by mouth. 08/08/17  Yes [provider]  phentermine (ADIPEX-P) 37.5 MG tablet Take 37.5 mg by mouth daily. 08/09/17  Yes [provider]  pantoprazole (PROTONIX) 40 MG tablet 1 po qd Patient not taking: Reported on 08/16/2017 01/07/11   Donato SchultzLowne Chase, Yvonne R, DO    Physical Exam Vitals: Blood pressure 118/74, pulse 72, height 5\' 4"  (1.626 m), weight 185 lb (83.9 kg), last menstrual period 08/11/2017.  General: NAD HEENT: normocephalic, anicteric Thyroid: no enlargement, no palpable nodules Pulmonary: No increased work of breathing, CTAB Cardiovascular: RRR, distal pulses 2+ Breast: Breast symmetrical, no tenderness, no palpable nodules or masses, no skin or nipple retraction present, no nipple discharge.  No axillary or supraclavicular lymphadenopathy. Abdomen: NABS, soft, non-tender, non-distended.  Umbilicus without lesions.  No hepatomegaly, splenomegaly or masses palpable. No evidence of hernia  Genitourinary:  External: Normal external female genitalia.  Normal urethral meatus, normal Bartholin's and Skene's glands.    Vagina: Normal vaginal mucosa, no evidence of prolapse.    Cervix: Grossly normal in appearance, no bleeding  Uterus: Non-enlarged, mobile, normal contour.  No CMT  Adnexa: ovaries non-enlarged, no adnexal masses  Rectal: deferred  Lymphatic: no evidence of inguinal lymphadenopathy Extremities: no edema, erythema, or tenderness Neurologic: Grossly intact Psychiatric: mood appropriate, affect full  Female chaperone present for  pelvic and breast  portions of the physical exam     Assessment: 52 y.o. [id number] routine annual exam  Plan: Problem List Items Addressed This Visit    None    Visit Diagnoses    Breast screening    -  Primary   Relevant Orders   MM DIAG BREAST TOMO BILATERAL   US BREAST LTD UNI RIGHT INC AXILLA   Encounter for gynecological examination without abnormal finding       Relevant Orders   FSH   Estradiol   Abnormal perimenopausal bleeding       Relevant Orders   FSH   Estradiol   US PELVIS TRANSVANGINAL NON-OB (TV ONLY)   Colon cancer screening       Relevant Orders   Ambulatory referral to Gastroenterology   BI-RADS category 3 mammogram result       Relevant Orders   MM DIAG BREAST TOMO BILATERAL   US BREAST  LTD UNI RIGHT INC AXILLA      1) Mammogram - recommend yearly screening mammogram.  Mammogram Was ordered today  2) STI screening  was notoffered and therefore not obtained  3) ASCCP guidelines and rational discussed.  Patient opts for every 3 years screening interval  4) Osteoporosis  - per USPTF routine screening DEXA at age 75 - FRAX 10 year major fracture risk 4.5%,  10 year hip fracture risk 0.2%  Consider FDA-approved medical therapies in postmenopausal women and men aged 41 years and older, based on the following: a) A hip or vertebral (clinical or morphometric) fracture b) T-score ? -2.5 at the femoral neck or spine after appropriate evaluation to exclude secondary causes C) Low bone mass (T-score between -1.0 and -2.5 at the femoral neck or spine) and a 10-year probability of a hip fracture ? 3% or a 10-year probability of a major osteoporosis-related fracture ? 20% based on the [place]-adapted WHO algorithm   5) Routine healthcare maintenance including cholesterol, diabetes screening discussed managed by PCP  6) Colonoscopy had about 5 years ago and was told need 5 year follow up [facility] [place]) - referral to GI  7) Perimenopausal bleeding pattern - discussed  that both cycle lengthening and shortening may be seen.  She has no intermenstrual spotting.  Will obtain FSH/Estradiol, TVUS in the next week to rule out uterine pathology.  I suspect patient is perimenopausal as opposed to postmenopausal.   Final management decision will hinge on results of patient's work up and whether an underlying etiology for the patients bleeding symptoms can be discerned.   8)  Return in about 1 week (around 08/23/2017) for TVUS and follow up.    Vena Austria, MD Domingo Pulse, [facility] 08/16/2017, 11:37 AM

## 2017-08-16 NOTE — Patient Instructions (Signed)
[facility] [address] [place] [address]  [facility] [place]  [address] [place] [address]  Phone: [phone]  

## 2017-08-17 ENCOUNTER — Encounter (INDEPENDENT_AMBULATORY_CARE_PROVIDER_SITE_OTHER): Payer: Self-pay

## 2017-08-17 LAB — ESTRADIOL: ESTRADIOL: 81.2 pg/mL

## 2017-08-17 LAB — FOLLICLE STIMULATING HORMONE: FSH: 11.6 m[IU]/mL

## 2017-08-18 ENCOUNTER — Other Ambulatory Visit: Payer: Self-pay

## 2017-08-19 ENCOUNTER — Telehealth: Payer: Self-pay | Admitting: Obstetrics and Gynecology

## 2017-08-19 NOTE — Telephone Encounter (Signed)
Patient is schedule 09/05/17 with AMS

## 2017-08-19 NOTE — Telephone Encounter (Signed)
Called and spoke with patient to schedule appointment. We lost connection so I called back and left voicemail for patient to call back to schedule TVUS and Follow up with AMS

## 2017-08-24 ENCOUNTER — Inpatient Hospital Stay
Admission: RE | Admit: 2017-08-24 | Discharge: 2017-08-24 | Disposition: A | Discharge status: Home / Self Care | Payer: Self-pay | Source: Ambulatory Visit | Attending: *Deleted | Admitting: *Deleted

## 2017-08-24 ENCOUNTER — Other Ambulatory Visit: Payer: Self-pay | Admitting: *Deleted

## 2017-08-24 DIAGNOSIS — Z9289 Personal history of other medical treatment: Secondary | ICD-10-CM

## 2017-09-05 ENCOUNTER — Encounter: Payer: Self-pay | Admitting: Obstetrics and Gynecology

## 2017-09-05 ENCOUNTER — Ambulatory Visit (INDEPENDENT_AMBULATORY_CARE_PROVIDER_SITE_OTHER): Payer: BC Managed Care – PPO | Admitting: Obstetrics and Gynecology

## 2017-09-05 ENCOUNTER — Ambulatory Visit (INDEPENDENT_AMBULATORY_CARE_PROVIDER_SITE_OTHER): Payer: BC Managed Care – PPO

## 2017-09-05 VITALS — BP 128/76 | HR 113 | Wt 180.0 lb

## 2017-09-05 DIAGNOSIS — N8302 Follicular cyst of left ovary: Secondary | ICD-10-CM | POA: Diagnosis not present

## 2017-09-05 DIAGNOSIS — N924 Excessive bleeding in the premenopausal period: Secondary | ICD-10-CM | POA: Diagnosis not present

## 2017-09-05 NOTE — Progress Notes (Signed)
Gynecology Ultrasound Follow Up  Chief Complaint:  Chief Complaint  Patient presents with  . Follow-up    GYN U/S     History of Present Illness: Patient is a 52 y.o. female who presents today for ultrasound evaluation of perimenopausal bleeding.  Ultrasound demonstrates the following findgins Adnexa: normal consistency Uterus: Non-enlarged, no evidence of fibroids with endometrial stripe 8mm without evidence of focal lesions Additional: No free fluid  FSH and LH obtained at time of last visit reviewed with patient.  Both are noted to be in the premenopausal range consistent with her being perimenopausal as opposed to postmenopausal.    Review of Systems: Review of Systems  Constitutional: Negative.   Gastrointestinal: Negative.   Endo/Heme/Allergies: Negative.     Past Medical History:  Past Medical History:  Diagnosis Date  . Depression   . Peptic ulcer disease     Past Surgical History:  Past Surgical History:  Procedure Laterality Date  . TUBAL LIGATION  2000    Gynecologic History:  Patient's last menstrual period was 08/11/2017 (exact date).  Family History:  Family History  Problem Relation Age of Onset  . Cancer Mother        bladder  . Pulmonary fibrosis Mother   . Hypertension Father   . Heart disease Father   . Kidney disease Father   . Cancer Maternal Grandmother        breast  . Hypertension Other   . Hyperlipidemia Other   . Kidney disease Other   . Thyroid disease Other     Social History:  Social History   Socioeconomic History  . Marital status: Married    Spouse name: Not on file  . Number of children: Not on file  . Years of education: Not on file  . Highest education level: Not on file  Occupational History  . Not on file  Social Needs  . Financial resource strain: Not on file  . Food insecurity:    Worry: Not on file    Inability: Not on file  . Transportation needs:    Medical: Not on file    Non-medical: Not on  file  Tobacco Use  . Smoking status: Never Smoker  . Smokeless tobacco: Never Used  Substance and Sexual Activity  . Alcohol use: Yes  . Drug use: No  . Sexual activity: Yes    Partners: Male    Birth control/protection: Surgical    Comment: BTL  Lifestyle  . Physical activity:    Days per week: 0 days    Minutes per session: 0 min  . Stress: Not at all  Relationships  . Social connections:    Talks on phone: Not on file    Gets together: Not on file    Attends religious service: Not on file    Active member of club or organization: Not on file    Attends meetings of clubs or organizations: Not on file    Relationship status: Not on file  . Intimate partner violence:    Fear of current or ex partner: Not on file    Emotionally abused: Not on file    Physically abused: Not on file    Forced sexual activity: Not on file  Other Topics Concern  . Not on file  Social History Narrative   No exercise    Allergies:  Allergies  Allergen Reactions  . Esomeprazole Magnesium     REACTION: itching    Medications: Prior to Admission  medications   Medication Sig Start Date End Date Taking? Authorizing Provider  lansoprazole (PREVACID) 15 MG capsule Take by mouth. 08/08/17   [provider]  pantoprazole (PROTONIX) 40 MG tablet 1 po qd Patient not taking: Reported on 08/16/2017 01/07/11   Donato SchultzLowne Chase, Yvonne R, DO  phentermine (ADIPEX-P) 37.5 MG tablet Take 37.5 mg by mouth daily. 08/09/17   [provider]    Physical Exam Vitals: Blood pressure 128/76, pulse (!) 113, weight 180 lb (81.6 kg), last menstrual period 08/11/2017.  General: NAD HEENT: normocephalic, anicteric Pulmonary: No increased work of breathing Extremities: no edema, erythema, or tenderness Neurologic: Grossly intact, normal gait Psychiatric: mood appropriate, affect full   Assessment: 52 y.o. [id number] of AUB  Plan: Problem List Items Addressed This Visit    None    Visit Diagnoses      Abnormal perimenopausal bleeding    -  Primary      1) Ultrasound and labs reviewed.  We discussed management option including cycle suppression via po progestin (norethindrone 0.35mg  or higher), Mirena IUD, Depo Provera, and endometrial ablation.  Her last few cycles have been light, not particularly bothersome and the patient opts for expectant management at this time.  Should she develop heavier or prolonged bleeding, intermenstrual spotting to represent to pursue one of the other management options discussed.   2) A total of 15 minutes were spent in face-to-face contact with the patient during this encounter with over half of that time devoted to counseling and coordination of care.  Vena AustriaAndreas Arriona Prest, MD, Evern CoreFACOG [facility], [facility] 09/05/2017, 2:26 PM

## 2017-09-09 ENCOUNTER — Other Ambulatory Visit: Payer: BC Managed Care – PPO

## 2017-09-15 ENCOUNTER — Ambulatory Visit
Admission: RE | Admit: 2017-09-15 | Discharge: 2017-09-15 | Disposition: A | Discharge status: Home / Self Care | Payer: BC Managed Care – PPO | Source: Ambulatory Visit | Attending: Obstetrics and Gynecology | Admitting: Obstetrics and Gynecology

## 2017-09-15 DIAGNOSIS — Z1231 Encounter for screening mammogram for malignant neoplasm of breast: Secondary | ICD-10-CM | POA: Diagnosis present

## 2017-09-15 DIAGNOSIS — R928 Other abnormal and inconclusive findings on diagnostic imaging of breast: Secondary | ICD-10-CM | POA: Diagnosis present

## 2017-09-15 DIAGNOSIS — Z1239 Encounter for other screening for malignant neoplasm of breast: Secondary | ICD-10-CM

## 2017-09-16 ENCOUNTER — Encounter (INDEPENDENT_AMBULATORY_CARE_PROVIDER_SITE_OTHER): Payer: Self-pay

## 2017-09-21 ENCOUNTER — Other Ambulatory Visit: Payer: Self-pay

## 2017-09-21 ENCOUNTER — Encounter
Admission: RE | Disposition: A | Discharge status: Home / Self Care | Payer: Self-pay | Source: Ambulatory Visit | Attending: Gastroenterology

## 2017-09-21 ENCOUNTER — Ambulatory Visit: Payer: BC Managed Care – PPO | Admitting: Certified Registered Nurse Anesthetist

## 2017-09-21 ENCOUNTER — Encounter: Payer: Self-pay | Admitting: Certified Registered Nurse Anesthetist

## 2017-09-21 ENCOUNTER — Ambulatory Visit
Admission: RE | Admit: 2017-09-21 | Discharge: 2017-09-21 | Disposition: A | Discharge status: Home / Self Care | Payer: BC Managed Care – PPO | Source: Ambulatory Visit | Attending: Gastroenterology | Admitting: Gastroenterology

## 2017-09-21 DIAGNOSIS — Z888 Allergy status to other drugs, medicaments and biological substances status: Secondary | ICD-10-CM | POA: Insufficient documentation

## 2017-09-21 DIAGNOSIS — K5730 Diverticulosis of large intestine without perforation or abscess without bleeding: Secondary | ICD-10-CM | POA: Insufficient documentation

## 2017-09-21 DIAGNOSIS — Z79899 Other long term (current) drug therapy: Secondary | ICD-10-CM | POA: Diagnosis not present

## 2017-09-21 DIAGNOSIS — Z1211 Encounter for screening for malignant neoplasm of colon: Secondary | ICD-10-CM

## 2017-09-21 DIAGNOSIS — F329 Major depressive disorder, single episode, unspecified: Secondary | ICD-10-CM | POA: Insufficient documentation

## 2017-09-21 DIAGNOSIS — Z86010 Personal history of colonic polyps: Secondary | ICD-10-CM | POA: Insufficient documentation

## 2017-09-21 DIAGNOSIS — Z8711 Personal history of peptic ulcer disease: Secondary | ICD-10-CM | POA: Diagnosis not present

## 2017-09-21 HISTORY — DX: Polyp of colon: K63.5

## 2017-09-21 HISTORY — PX: COLONOSCOPY WITH PROPOFOL: SHX5780

## 2017-09-21 SURGERY — COLONOSCOPY WITH PROPOFOL
Anesthesia: General

## 2017-09-21 MED ORDER — LIDOCAINE HCL (CARDIAC) PF 100 MG/5ML IV SOSY
PREFILLED_SYRINGE | INTRAVENOUS | Status: DC | PRN
  Administered 2017-09-21: 50 mg via INTRAVENOUS

## 2017-09-21 MED ORDER — PROPOFOL 500 MG/50ML IV EMUL
INTRAVENOUS | Status: AC
  Filled 2017-09-21: qty 50

## 2017-09-21 MED ORDER — SODIUM CHLORIDE 0.9 % IV SOLN
INTRAVENOUS | Status: DC
  Administered 2017-09-21: 08:00:00 via INTRAVENOUS

## 2017-09-21 MED ORDER — LIDOCAINE HCL (PF) 2 % IJ SOLN
INTRAMUSCULAR | Status: AC
  Filled 2017-09-21: qty 10

## 2017-09-21 MED ORDER — EPHEDRINE SULFATE 50 MG/ML IJ SOLN
INTRAMUSCULAR | Status: AC
  Filled 2017-09-21: qty 1

## 2017-09-21 MED ORDER — PROPOFOL 500 MG/50ML IV EMUL
INTRAVENOUS | Status: DC | PRN
  Administered 2017-09-21: 175 ug/kg/min via INTRAVENOUS

## 2017-09-21 MED ORDER — PROPOFOL 10 MG/ML IV BOLUS
INTRAVENOUS | Status: DC | PRN
  Administered 2017-09-21: 50 mg via INTRAVENOUS
  Administered 2017-09-21: 70 mg via INTRAVENOUS
  Administered 2017-09-21: 30 mg via INTRAVENOUS
  Administered 2017-09-21: 40 mg via INTRAVENOUS

## 2017-09-21 NOTE — H&P (Signed)
Melodie Bouillon, MD [address], [place], [place], [address] [address], [place], [place], [address] Phone: [phone]  Fax: [phone]  Primary Care Physician:  Vena Austria, MD   Pre-Procedure History & Physical: HPI:  Sandra Barnett is a 52 y.o. female is here for a colonoscopy.   Past Medical History:  Diagnosis Date  . Colon polyp   . Depression   . Peptic ulcer disease     Past Surgical History:  Procedure Laterality Date  . COLONOSCOPY WITH ESOPHAGOGASTRODUODENOSCOPY (EGD)    . TUBAL LIGATION  2000    Prior to Admission medications   Medication Sig Start Date End Date Taking? Authorizing Provider  lansoprazole (PREVACID) 15 MG capsule Take by mouth. 08/08/17  Yes [provider]  pantoprazole (PROTONIX) 40 MG tablet 1 po qd Patient not taking: Reported on 08/16/2017 01/07/11   Donato Schultz, DO  phentermine (ADIPEX-P) 37.5 MG tablet Take 37.5 mg by mouth daily. 08/09/17   [provider]    Allergies as of 08/16/2017 - Review Complete 08/16/2017  Allergen Reaction Noted  . Esomeprazole magnesium  10/11/2007    Family History  Problem Relation Age of Onset  . Cancer Mother        bladder  . Pulmonary fibrosis Mother   . Hypertension Father   . Heart disease Father   . Kidney disease Father   . Cancer Maternal Grandmother        breast  . Breast cancer Maternal Grandmother 40  . Hypertension Other   . Hyperlipidemia Other   . Kidney disease Other   . Thyroid disease Other     Social History   Socioeconomic History  . Marital status: Married    Spouse name: Not on file  . Number of children: Not on file  . Years of education: Not on file  . Highest education level: Not on file  Occupational History  . Not on file  Social Needs  . Financial resource strain: Not on file  . Food insecurity:    Worry: Not on file    Inability: Not on file  . Transportation needs:    Medical: Not on  file    Non-medical: Not on file  Tobacco Use  . Smoking status: Never Smoker  . Smokeless tobacco: Never Used  Substance and Sexual Activity  . Alcohol use: Yes    Comment: occ  . Drug use: No  . Sexual activity: Yes    Partners: Male    Birth control/protection: Surgical    Comment: BTL  Lifestyle  . Physical activity:    Days per week: 0 days    Minutes per session: 0 min  . Stress: Not at all  Relationships  . Social connections:    Talks on phone: Not on file    Gets together: Not on file    Attends religious service: Not on file    Active member of club or organization: Not on file    Attends meetings of clubs or organizations: Not on file    Relationship status: Not on file  . Intimate partner violence:    Fear of current or ex partner: Not on file    Emotionally abused: Not on file    Physically abused: Not on file    Forced sexual activity: Not on file  Other Topics Concern  . Not on file  Social History Narrative   No exercise    Review of Systems: See  HPI, otherwise negative ROS  Physical Exam: BP 132/86   Pulse 67   Temp (!) 97 F (36.1 C) (Tympanic)   Resp 18   Ht 5\' 4"  (1.626 m)   Wt 180 lb (81.6 kg)   LMP 08/29/2017 Comment: 09/21/17 preg neg  SpO2 100%   BMI 30.90 kg/m  General:   Alert,  pleasant and cooperative in NAD Head:  Normocephalic and atraumatic. Neck:  Supple; no masses or thyromegaly. Lungs:  Clear throughout to auscultation, normal respiratory effort.    Heart:  +S1, +S2, Regular rate and rhythm, No edema. Abdomen:  Soft, nontender and nondistended. Normal bowel sounds, without guarding, and without rebound.   Neurologic:  Alert and  oriented x4;  grossly normal neurologically.  Impression/Plan: Sandra Barnett is here for a colonoscopy to be performed for average risk screening.  Risks, benefits, limitations, and alternatives regarding  colonoscopy have been reviewed with the patient.  Questions have been answered.  All  parties agreeable.   Pasty SpillersVarnita B Tahiliani, MD  09/21/2017, 8:07 AM

## 2017-09-21 NOTE — Anesthesia Post-op Follow-up Note (Signed)
Anesthesia QCDR form completed.        

## 2017-09-21 NOTE — Anesthesia Procedure Notes (Signed)
Date/Time: 09/21/2017 8:10 AM Performed by: Ginger CarneMichelet, Rayshaun Needle, CRNA Pre-anesthesia Checklist: Patient identified, Emergency Drugs available, Patient being monitored, Suction available and Timeout performed Patient Re-evaluated:Patient Re-evaluated prior to induction Oxygen Delivery Method: Nasal cannula Preoxygenation: Pre-oxygenation with 100% oxygen

## 2017-09-21 NOTE — Anesthesia Preprocedure Evaluation (Addendum)
Anesthesia Evaluation  Patient identified by MRN, date of birth, ID band Patient awake    Reviewed: Allergy & Precautions, NPO status , Patient's Chart, lab work & pertinent test results  Airway Mallampati: II  TM Distance: <3 FB     Dental   Pulmonary neg pulmonary ROS,    Pulmonary exam normal        Cardiovascular negative cardio ROS Normal cardiovascular exam     Neuro/Psych PSYCHIATRIC DISORDERS Depression negative neurological ROS     GI/Hepatic Neg liver ROS, PUD,   Endo/Other  negative endocrine ROS  Renal/GU negative Renal ROS  negative genitourinary   Musculoskeletal negative musculoskeletal ROS (+)   Abdominal Normal abdominal exam  (+)   Peds negative pediatric ROS (+)  Hematology  (+) anemia ,   Anesthesia Other Findings   Reproductive/Obstetrics                            Anesthesia Physical Anesthesia Plan  ASA: II  Anesthesia Plan: General   Post-op Pain Management:    Induction: Intravenous  PONV Risk Score and Plan: TIVA  Airway Management Planned: Nasal Cannula  Additional Equipment:   Intra-op Plan:   Post-operative Plan:   Informed Consent: I have reviewed the patients History and Physical, chart, labs and discussed the procedure including the risks, benefits and alternatives for the proposed anesthesia with the patient or authorized representative who has indicated his/her understanding and acceptance.   Dental advisory given  Plan Discussed with: CRNA and Surgeon  Anesthesia Plan Comments:         Anesthesia Quick Evaluation

## 2017-09-21 NOTE — Transfer of Care (Signed)
Immediate Anesthesia Transfer of Care Note  Patient: Sandra Barnett  Procedure(s) Performed: COLONOSCOPY WITH PROPOFOL (N/A )  Patient Location: PACU  Anesthesia Type:General  Level of Consciousness: drowsy  Airway & Oxygen Therapy: Patient Spontanous Breathing and Patient connected to nasal cannula oxygen  Post-op Assessment: Report given to RN and Post -op Vital signs reviewed and stable  Post vital signs: Reviewed and stable  Last Vitals:  Vitals Value Taken Time  BP 117/75 09/21/2017  8:46 AM  Temp 36.2 C 09/21/2017  8:46 AM  Pulse 76 09/21/2017  8:46 AM  Resp 22 09/21/2017  8:46 AM  SpO2 100 % 09/21/2017  8:46 AM  Vitals shown include unvalidated device data.  Last Pain:  Vitals:   09/21/17 0846  TempSrc: Temporal  PainSc:          Complications: No apparent anesthesia complications

## 2017-09-21 NOTE — Op Note (Signed)
[facility] Patient Name: Sandra Barnett Procedure Date: 09/21/2017 7:56 AM MRN: [id number] Account #: [id number] Date of Birth: 16-Feb-1966 Admit Type: Outpatient Age: 52 Room: [facility] ENDO ROOM 2 Gender: Female Note Status: Finalized Procedure:            Colonoscopy Indications:          Screening for colorectal malignant neoplasm Providers:            Jamariya Davidoff B. Maximino Greenland MD, MD Referring MD:         Florina Ou. Bonney Aid (Referring MD) Medicines:            Monitored Anesthesia Care Complications:        No immediate complications. Procedure:            Pre-Anesthesia Assessment:                       - Prior to the procedure, a History and Physical was                        performed, and patient medications, allergies and                        sensitivities were reviewed. The patient's tolerance of                        previous anesthesia was reviewed.                       - The risks and benefits of the procedure and the                        sedation options and risks were discussed with the                        patient. All questions were answered and informed                        consent was obtained.                       - Patient identification and proposed procedure were                        verified prior to the procedure by the physician, the                        nurse, the anesthetist and the technician. The                        procedure was verified in the pre-procedure area in the                        procedure room in the endoscopy suite.                       - ASA Grade Assessment: II - A patient with mild                        systemic disease.                       -  After reviewing the risks and benefits, the patient                        was deemed in satisfactory condition to undergo the                        procedure.                       After obtaining informed consent, the colonoscope was                    passed under direct vision. Throughout the procedure,                        the patient's blood pressure, pulse, and oxygen                        saturations were monitored continuously. The                        Colonoscope was introduced through the anus and                        advanced to the the cecum, identified by appendiceal                        orifice and ileocecal valve. The colonoscopy was                        performed with ease. The patient tolerated the                        procedure well. The quality of the bowel preparation                        was good. Findings:      The perianal and digital rectal examinations were normal.      A few diverticula were found in the sigmoid colon.      The exam was otherwise without abnormality.      The rectum, sigmoid colon, descending colon, transverse colon, ascending       colon and cecum appeared normal.      The retroflexed view of the distal rectum and anal verge was normal and       showed no anal or rectal abnormalities. Impression:           - Diverticulosis in the sigmoid colon.                       - The examination was otherwise normal.                       - The rectum, sigmoid colon, descending colon,                        transverse colon, ascending colon and cecum are normal.                       - The distal rectum and anal verge are normal on  retroflexion view.                       - No specimens collected. Recommendation:       - Discharge patient to home.                       - Resume previous diet.                       - Continue present medications.                       - Repeat colonoscopy in 5 years due to previous history                        of polyps as per patient (Procedure report not                        available, done in [place] as per patient. PCP has                        patient's records as per PCP notes in Epic. And PC note                         states colonoscopy was recommended in 5 years after                        last procedure. PCP to review those records and if                        polyp was hyperplastic during that procedure, patient's                        repeat colonoscopy can be in 10 years. If that polyp                        showed adenoma, repeat colonoscopy can be in 5 years                        for surveillance).                       - Return to primary care physician as previously                        scheduled.                       - The findings and recommendations were discussed with                        the patient.                       - The findings and recommendations were discussed with                        the patient's family.                       - High fiber diet. Procedure Code(s):    ---  Professional ---                       U9811, Colorectal cancer screening; colonoscopy on                        individual not meeting criteria for high risk Diagnosis Code(s):    --- Professional ---                       Z12.11, Encounter for screening for malignant neoplasm                        of colon                       K57.30, Diverticulosis of large intestine without                        perforation or abscess without bleeding CPT copyright 2017 American Medical Association. All rights reserved. The codes documented in this report are preliminary and upon coder review may  be revised to meet current compliance requirements.  Melodie Bouillon, MD Michel Bickers B. Maximino Greenland MD, MD 09/21/2017 8:49:38 AM This report has been signed electronically. Number of Addenda: 0 Note Initiated On: 09/21/2017 7:56 AM Scope Withdrawal Time: 0 hours 17 minutes 50 seconds  Total Procedure Duration: 0 hours 26 minutes 20 seconds       [facility]

## 2017-09-22 ENCOUNTER — Encounter: Payer: Self-pay | Admitting: Gastroenterology

## 2017-09-22 NOTE — Anesthesia Postprocedure Evaluation (Signed)
Anesthesia Post Note  Patient: Sandra Barnett  Procedure(s) Performed: COLONOSCOPY WITH PROPOFOL (N/A )  Patient location during evaluation: PACU Anesthesia Type: General Level of consciousness: awake and alert and oriented Pain management: pain level controlled Vital Signs Assessment: post-procedure vital signs reviewed and stable Respiratory status: spontaneous breathing Cardiovascular status: blood pressure returned to baseline Anesthetic complications: no     Last Vitals:  Vitals:   09/21/17 0900 09/21/17 0910  BP: 128/88 133/84  Pulse: 75 79  Resp: 20 19  Temp:    SpO2: 100% 100%    Last Pain:  Vitals:   09/21/17 0846  TempSrc: Temporal  PainSc:                  [name]

## 2017-10-18 ENCOUNTER — Ambulatory Visit (INDEPENDENT_AMBULATORY_CARE_PROVIDER_SITE_OTHER): Payer: BC Managed Care – PPO | Admitting: Obstetrics and Gynecology

## 2017-10-18 ENCOUNTER — Encounter: Payer: Self-pay | Admitting: Obstetrics and Gynecology

## 2017-10-18 ENCOUNTER — Other Ambulatory Visit (HOSPITAL_COMMUNITY)
Admission: RE | Admit: 2017-10-18 | Discharge: 2017-10-18 | Disposition: A | Discharge status: Home / Self Care | Payer: BC Managed Care – PPO | Source: Ambulatory Visit | Attending: Obstetrics and Gynecology | Admitting: Obstetrics and Gynecology

## 2017-10-18 VITALS — BP 134/88 | HR 111 | Ht 64.0 in | Wt 179.0 lb

## 2017-10-18 DIAGNOSIS — N939 Abnormal uterine and vaginal bleeding, unspecified: Secondary | ICD-10-CM

## 2017-10-18 DIAGNOSIS — Z1151 Encounter for screening for human papillomavirus (HPV): Secondary | ICD-10-CM | POA: Insufficient documentation

## 2017-10-18 DIAGNOSIS — Z124 Encounter for screening for malignant neoplasm of cervix: Secondary | ICD-10-CM | POA: Insufficient documentation

## 2017-10-18 DIAGNOSIS — Z30430 Encounter for insertion of intrauterine contraceptive device: Secondary | ICD-10-CM | POA: Diagnosis not present

## 2017-10-18 NOTE — Progress Notes (Signed)
   GYNECOLOGY OFFICE PROCEDURE NOTE  Sandra Barnett is a 52 y.o. [id number] here for a Mirena IUD insertion. No GYN concerns.  Pap smear obtained today.  The patient is currently using nothing for contraception and her LMP is Patient's last menstrual period was 10/13/2017 (exact date)..  The indication for her IUD is cycle control.  Evaluation prior to procedure included normal TVUS, ovaries showing follicular development, trilaminar stripe, and FSH/LH levels well into the premenopausal range still.    IUD Insertion Procedure Note Patient identified, informed consent performed, consent signed.   Discussed risks of irregular bleeding, cramping, infection, malpositioning, expulsion or uterine perforation of the IUD (1:1000 placements)  which may require further procedure such as laparoscopy.  IUD while effective at preventing pregnancy do not prevent transmission of sexually transmitted diseases and use of barrier methods for this purpose was discussed. Time out was performed.  Urine pregnancy test negative.  Speculum placed in the vagina.  Cervix visualized.  Cleaned with Betadine x 2.  Grasped anteriorly with a single tooth tenaculum.  Uterus sounded to 8.5 cm. IUD placed per manufacturer's recommendations.  Strings trimmed to 3 cm. Tenaculum was removed, good hemostasis noted.  Patient tolerated procedure well.   Patient was given post-procedure instructions.  She was advised to have backup contraception for one week.  Patient was also asked to check IUD strings periodically and follow up in 6 weeks for IUD check.  Vena AustriaAndreas Rosaly Labarbera, MD, Merlinda FrederickFACOG [facility], [facility]

## 2017-10-19 ENCOUNTER — Ambulatory Visit: Payer: Self-pay | Admitting: Obstetrics and Gynecology

## 2017-10-20 LAB — CYTOLOGY - PAP
Diagnosis: NEGATIVE
HPV (WINDOPATH): NOT DETECTED

## 2017-11-30 ENCOUNTER — Encounter: Payer: Self-pay | Admitting: Obstetrics and Gynecology

## 2017-11-30 ENCOUNTER — Ambulatory Visit (INDEPENDENT_AMBULATORY_CARE_PROVIDER_SITE_OTHER): Payer: BC Managed Care – PPO | Admitting: Obstetrics and Gynecology

## 2017-11-30 VITALS — BP 126/78 | HR 110 | Wt 183.0 lb

## 2017-11-30 DIAGNOSIS — Z30431 Encounter for routine checking of intrauterine contraceptive device: Secondary | ICD-10-CM | POA: Diagnosis not present

## 2017-11-30 NOTE — Progress Notes (Signed)
Obstetrics & Gynecology Office Visit   Chief Complaint:  Chief Complaint  Patient presents with  . Follow-up    Mirena IUD string check    History of Present Illness: 52 y.o. patient presenting for follow up of Mirena IUD placement 6+ weeks ago.  The indication for her IUD was cycle control.  She denies any complications since her IUD placement.  Still having some occasional spotting.  Has not checked for strings.  Review of Systems: Review of Systems  Constitutional: Negative.   Gastrointestinal: Negative.   Genitourinary: Negative.     Past Medical History:  Past Medical History:  Diagnosis Date  . Colon polyp   . Depression   . Peptic ulcer disease     Past Surgical History:  Past Surgical History:  Procedure Laterality Date  . COLONOSCOPY WITH ESOPHAGOGASTRODUODENOSCOPY (EGD)    . COLONOSCOPY WITH PROPOFOL N/A 09/21/2017   Procedure: COLONOSCOPY WITH PROPOFOL;  Surgeon: Pasty Spillers, MD;  Location: [facility] ENDOSCOPY;  Service: Endoscopy;  Laterality: N/A;  . TUBAL LIGATION  2000    Gynecologic History: No LMP recorded. (Menstrual status: IUD).  Obstetric History: G5P0020  Family History:  Family History  Problem Relation Age of Onset  . Cancer Mother        bladder  . Pulmonary fibrosis Mother   . Hypertension Father   . Heart disease Father   . Kidney disease Father   . Cancer Maternal Grandmother        breast  . Breast cancer Maternal Grandmother 40  . Hypertension Other   . Hyperlipidemia Other   . Kidney disease Other   . Thyroid disease Other     Social History:  Social History   Socioeconomic History  . Marital status: Married    Spouse name: Not on file  . Number of children: Not on file  . Years of education: Not on file  . Highest education level: Not on file  Occupational History  . Not on file  Social Needs  . Financial resource strain: Not on file  . Food insecurity:    Worry: Not on file    Inability: Not on file  .  Transportation needs:    Medical: Not on file    Non-medical: Not on file  Tobacco Use  . Smoking status: Never Smoker  . Smokeless tobacco: Never Used  Substance and Sexual Activity  . Alcohol use: Yes    Comment: occ  . Drug use: No  . Sexual activity: Yes    Partners: Male    Birth control/protection: Surgical    Comment: BTL  Lifestyle  . Physical activity:    Days per week: 0 days    Minutes per session: 0 min  . Stress: Not at all  Relationships  . Social connections:    Talks on phone: Not on file    Gets together: Not on file    Attends religious service: Not on file    Active member of club or organization: Not on file    Attends meetings of clubs or organizations: Not on file    Relationship status: Not on file  . Intimate partner violence:    Fear of current or ex partner: Not on file    Emotionally abused: Not on file    Physically abused: Not on file    Forced sexual activity: Not on file  Other Topics Concern  . Not on file  Social History Narrative   No exercise  Allergies:  Allergies  Allergen Reactions  . Esomeprazole Magnesium     REACTION: itching    Medications: Prior to Admission medications   Medication Sig Start Date End Date Taking? Authorizing Provider  lansoprazole (PREVACID) 15 MG capsule Take by mouth. 08/08/17   [provider]  pantoprazole (PROTONIX) 40 MG tablet 1 po qd 01/07/11   Donato Schultz, DO    Physical Exam Blood pressure 126/78, pulse (!) 110, weight 183 lb (83 kg). No LMP recorded. (Menstrual status: IUD).  General: NAD HEENT: normocephalic, anicteric Pulmonary: No increased work of breathing  Genitourinary:  External: Normal external female genitalia.  Normal urethral meatus, normal  Bartholin's and Skene's glands.    Vagina: Normal vaginal mucosa, no evidence of prolapse.    Cervix: Grossly normal in appearance, no bleeding, IUD strings visualized 2cm  Uterus: Non-enlarged, mobile, normal  contour.  No CMT  Adnexa: ovaries non-enlarged, no adnexal masses  Rectal: deferred  Lymphatic: no evidence of inguinal lymphadenopathy Extremities: no edema, erythema, or tenderness Neurologic: Grossly intact Psychiatric: mood appropriate, affect full  Female chaperone present for pelvic and breast  portions of the physical exam  Assessment: 52 y.o. [id number] IUD string check  Plan: Problem List Items Addressed This Visit    None    Visit Diagnoses    IUD check up    -  Primary       1.  The patient was given instructions to check her IUD strings monthly and call with any problems or concerns.  She should call for fevers, chills, abnormal vaginal discharge, pelvic pain, or other complaints.  2.   IUDs while effective at preventing pregnancy do not prevent transmission of sexually transmitted diseases and use of barrier methods for this purpose was discussed.  Low overall incidence of failure with 99.7% efficacy rate in typical use.  The patient has not contraindication to IUD placement.  3.  She will return for a annual exam in 1 year.  All questions answered.  4) A total of 15 minutes were spent in face-to-face contact with the patient during this encounter with over half of that time devoted to counseling and coordination of care.  5) Return in about 1 year (around 12/01/2018) for annual.   Vena Austria, MD, [facility], [facility] 11/30/2017, 9:32 PM

## 2018-10-04 ENCOUNTER — Other Ambulatory Visit: Payer: Self-pay | Admitting: Obstetrics and Gynecology

## 2018-10-04 DIAGNOSIS — Z1231 Encounter for screening mammogram for malignant neoplasm of breast: Secondary | ICD-10-CM

## 2018-11-01 ENCOUNTER — Encounter: Payer: Self-pay | Admitting: Obstetrics and Gynecology

## 2018-11-01 ENCOUNTER — Other Ambulatory Visit: Payer: Self-pay

## 2018-11-01 ENCOUNTER — Ambulatory Visit: Payer: BC Managed Care – PPO | Admitting: Obstetrics and Gynecology

## 2018-11-01 VITALS — BP 132/84 | Wt 184.0 lb

## 2018-11-01 DIAGNOSIS — Z1239 Encounter for other screening for malignant neoplasm of breast: Secondary | ICD-10-CM | POA: Diagnosis not present

## 2018-11-01 DIAGNOSIS — Z30431 Encounter for routine checking of intrauterine contraceptive device: Secondary | ICD-10-CM | POA: Diagnosis not present

## 2018-11-01 DIAGNOSIS — N939 Abnormal uterine and vaginal bleeding, unspecified: Secondary | ICD-10-CM

## 2018-11-01 NOTE — Patient Instructions (Signed)
[facility] [address] [place] [address]  [facility]  [address] [place] [address]  Phone: [phone]  

## 2018-11-01 NOTE — Progress Notes (Signed)
Obstetrics & Gynecology Office Visit   Chief Complaint:  Chief Complaint  Patient presents with  . Follow-up    IUD string check    History of Present Illness: 53 y.o. patient presenting for follow up of Mirena IUD placement 1 year ago.  The indication for her IUD was cycle control.  She denies any complications since her IUD placement.  She has had some unscheduled breakthrough bleeding recently..  FSH/estradiol last year were well in the premenopausal range last year but she has noted some increased vasomotor symptoms at night.    Review of Systems: Review of Systems  Constitutional: Negative.   Gastrointestinal: Negative for abdominal pain.  Genitourinary: Negative.     Past Medical History:  Past Medical History:  Diagnosis Date  . Colon polyp   . Depression   . Peptic ulcer disease     Past Surgical History:  Past Surgical History:  Procedure Laterality Date  . COLONOSCOPY WITH ESOPHAGOGASTRODUODENOSCOPY (EGD)    . COLONOSCOPY WITH PROPOFOL N/A 09/21/2017   Procedure: COLONOSCOPY WITH PROPOFOL;  Surgeon: Pasty Spillersahiliani, Varnita B, MD;  Location: [facility] ENDOSCOPY;  Service: Endoscopy;  Laterality: N/A;  . TUBAL LIGATION  2000    Gynecologic History: No LMP recorded. (Menstrual status: IUD).  Obstetric History: G5P0020  Family History:  Family History  Problem Relation Age of Onset  . Cancer Mother        bladder  . Pulmonary fibrosis Mother   . Hypertension Father   . Heart disease Father   . Kidney disease Father   . Cancer Maternal Grandmother        breast  . Breast cancer Maternal Grandmother 40  . Hypertension Other   . Hyperlipidemia Other   . Kidney disease Other   . Thyroid disease Other     Social History:  Social History   Socioeconomic History  . Marital status: Married    Spouse name: Not on file  . Number of children: Not on file  . Years of education: Not on file  . Highest education level: Not on file  Occupational History  . Not on  file  Social Needs  . Financial resource strain: Not on file  . Food insecurity    Worry: Not on file    Inability: Not on file  . Transportation needs    Medical: Not on file    Non-medical: Not on file  Tobacco Use  . Smoking status: Never Smoker  . Smokeless tobacco: Never Used  Substance and Sexual Activity  . Alcohol use: Yes    Comment: occ  . Drug use: No  . Sexual activity: Yes    Partners: Male    Birth control/protection: Surgical    Comment: BTL  Lifestyle  . Physical activity    Days per week: 0 days    Minutes per session: 0 min  . Stress: Not at all  Relationships  . Social Musicianconnections    Talks on phone: Not on file    Gets together: Not on file    Attends religious service: Not on file    Active member of club or organization: Not on file    Attends meetings of clubs or organizations: Not on file    Relationship status: Not on file  . Intimate partner violence    Fear of current or ex partner: Not on file    Emotionally abused: Not on file    Physically abused: Not on file    Forced  sexual activity: Not on file  Other Topics Concern  . Not on file  Social History Narrative   No exercise    Allergies:  Allergies  Allergen Reactions  . Esomeprazole Magnesium     REACTION: itching    Medications: Prior to Admission medications   Medication Sig Start Date End Date Taking? Authorizing Provider  lansoprazole (PREVACID) 15 MG capsule Take by mouth. 08/08/17   [provider]  pantoprazole (PROTONIX) 40 MG tablet 1 po qd 01/07/11   Ann Held, DO    Physical Exam Blood pressure 132/84, weight 184 lb (83.5 kg). No LMP recorded. (Menstrual status: IUD).  General: NAD HEENT: normocephalic, anicteric Pulmonary: No increased work of breathing  Genitourinary:  External: Normal external female genitalia.  Normal urethral meatus, normal  Bartholin's and Skene's glands.    Vagina: Normal vaginal mucosa, no evidence of prolapse.     Cervix: Grossly normal in appearance, no bleeding, IUD strings visualized 2cm  Uterus: Non-enlarged, mobile, normal contour.  No CMT  Adnexa: ovaries non-enlarged, no adnexal masses  Rectal: deferred  Lymphatic: no evidence of inguinal lymphadenopathy Extremities: no edema, erythema, or tenderness Neurologic: Grossly intact Psychiatric: mood appropriate, affect full  Female chaperone present for pelvic and breast  portions of the physical exam  Assessment: 53 y.o. [id number] No problem-specific Assessment & Plan notes found for this encounter.   Plan: Problem List Items Addressed This Visit    None       1.  The patient was given instructions to check her IUD strings monthly and call with any problems or concerns.  She should call for fevers, chills, abnormal vaginal discharge, pelvic pain, or other complaints.  2.   IUD strings good position, check FSH/Estradiol if postmenopausal range remove IUD if not consider [place] to evalute IUD placement  3.  She will return for a annual exam in 1 year.  All questions answered.  4) A total of 15 minutes were spent in face-to-face contact with the patient during this encounter with over half of that time devoted to counseling and coordination of care.  5) No follow-ups on file.   Malachy Mood, MD, [facility], [facility] 11/01/2018, 3:38 PM

## 2018-11-02 LAB — FOLLICLE STIMULATING HORMONE: FSH: 6.8 m[IU]/mL

## 2018-11-02 LAB — ESTRADIOL: Estradiol: 261 pg/mL

## 2018-11-13 ENCOUNTER — Ambulatory Visit
Admission: RE | Admit: 2018-11-13 | Discharge: 2018-11-13 | Disposition: A | Discharge status: Home / Self Care | Payer: BC Managed Care – PPO | Source: Ambulatory Visit | Attending: Obstetrics and Gynecology | Admitting: Obstetrics and Gynecology

## 2018-11-13 DIAGNOSIS — Z1231 Encounter for screening mammogram for malignant neoplasm of breast: Secondary | ICD-10-CM | POA: Diagnosis present

## 2021-03-16 IMAGING — MG MM DIGITAL SCREENING BILAT W/ TOMO W/ CAD
8 series · 8 of 24 positions shown · non-contrast
Comparison: Previous exam(s).

CLINICAL DATA: Screening.

EXAM:
DIGITAL SCREENING BILATERAL MAMMOGRAM WITH TOMO AND CAD

[L MLO synth-2D]
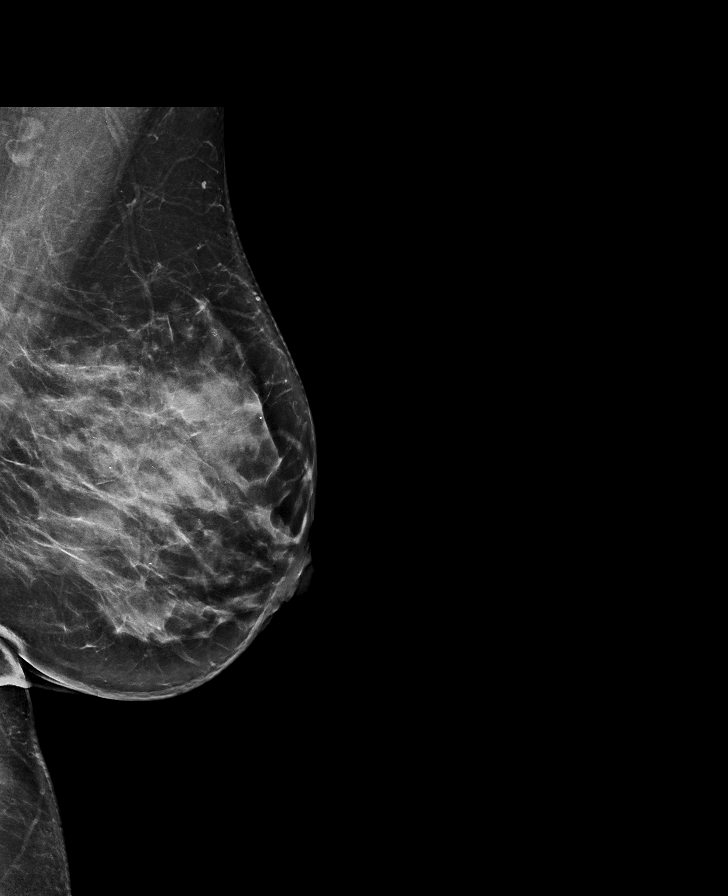

[L CC synth-2D]
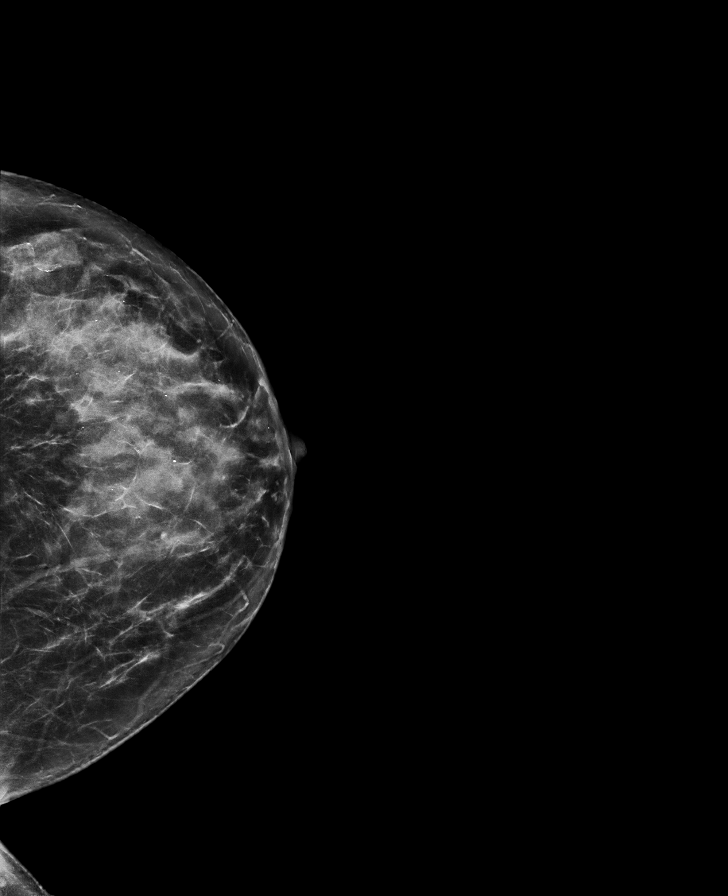

[R CC synth-2D]
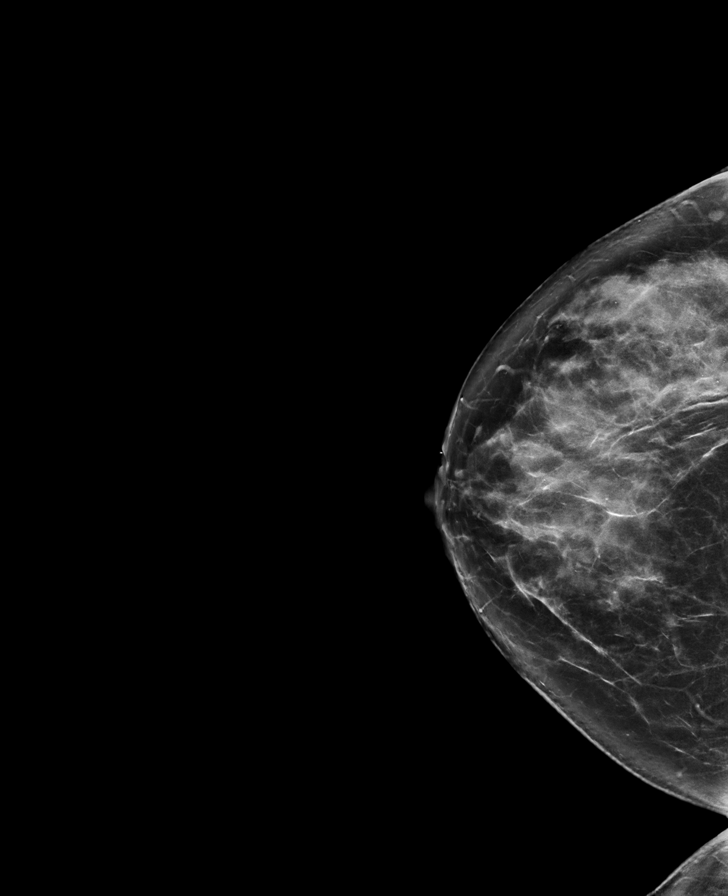

[R MLO synth-2D]
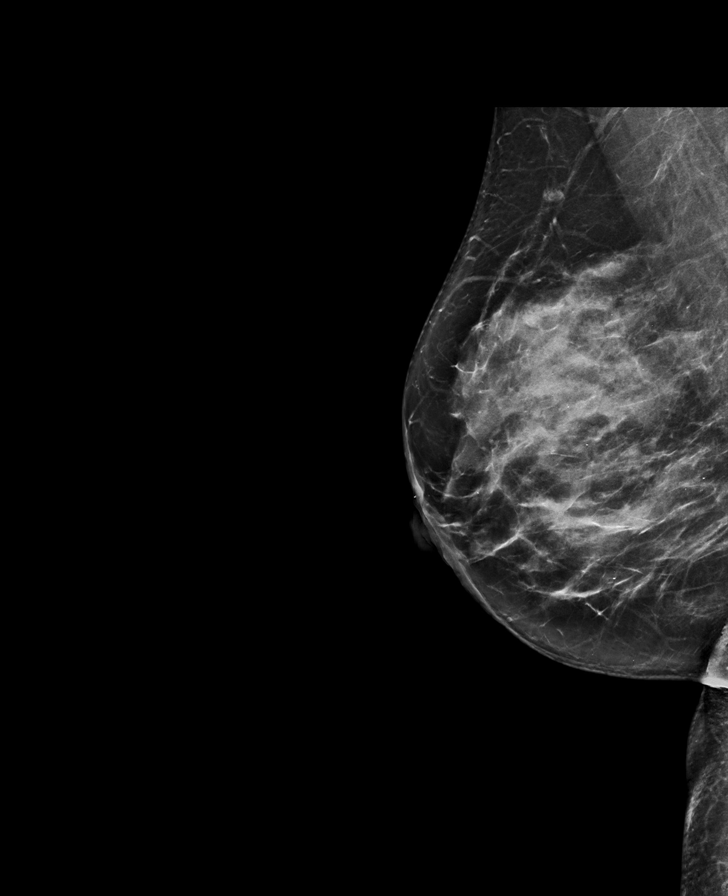

[L CC tomo · tomo slice 35/70.0]
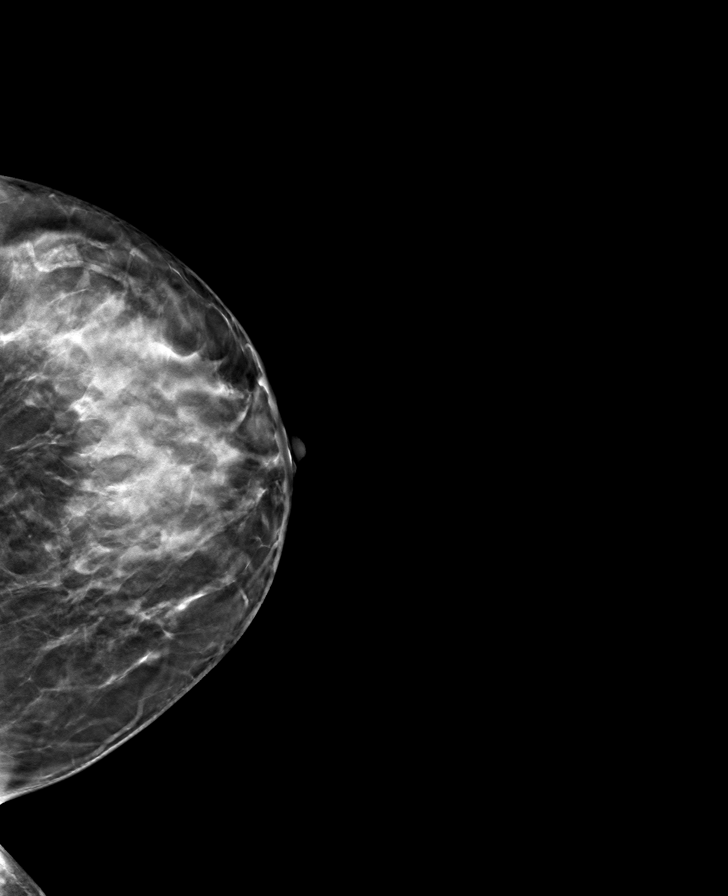

[R CC tomo · tomo slice 39/78.0]
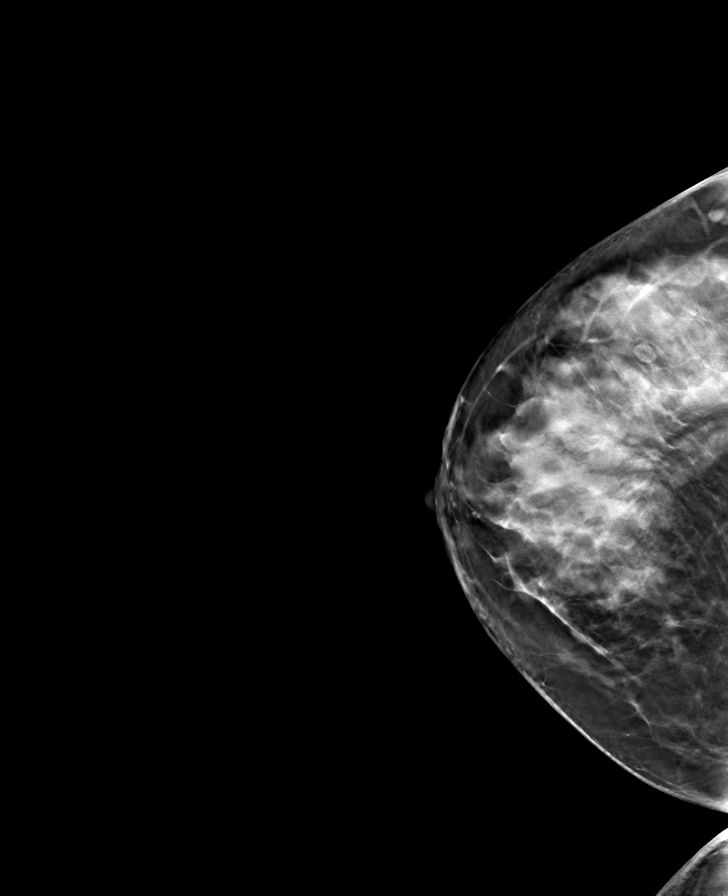

[L MLO tomo · tomo slice 43/84.0]
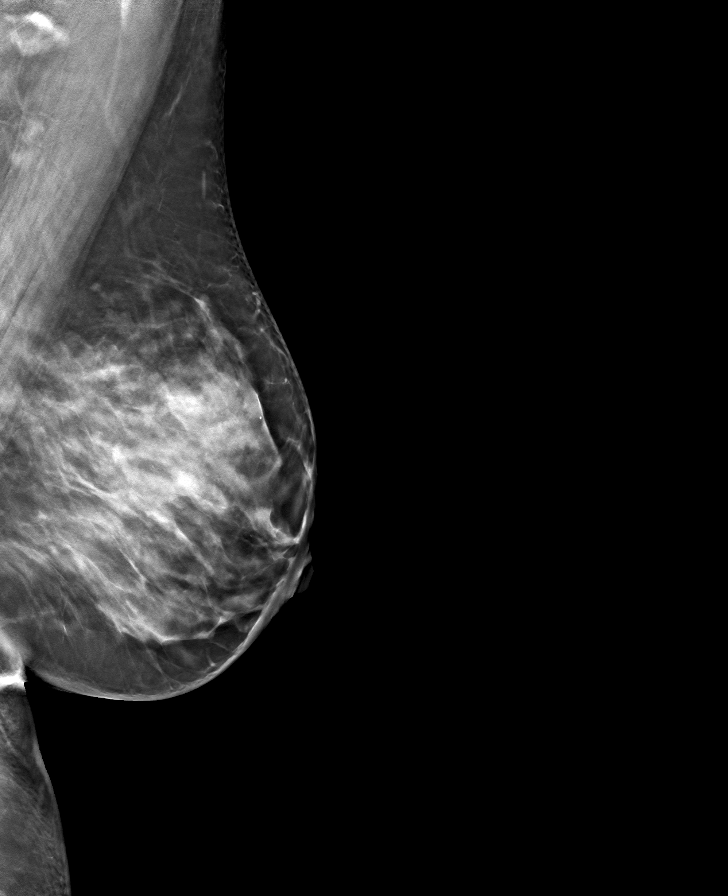

[R MLO tomo · tomo slice 42/83.0]
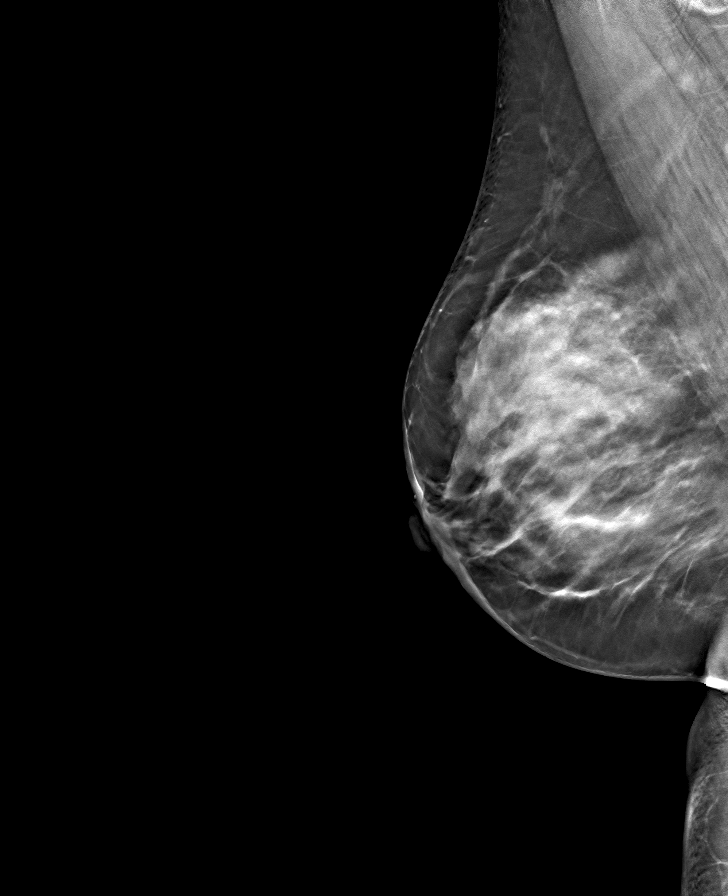

[8 of 24 positions shown; findings below may reference images not displayed]

ACR Breast Density Category c: The breast tissue is heterogeneously
dense, which may obscure small masses.
FINDINGS: There are no findings suspicious for malignancy. Images were
processed with CAD.
IMPRESSION: No mammographic evidence of malignancy. A result letter of this
screening mammogram will be mailed directly to the patient.

RECOMMENDATION:
Screening mammogram in one year. (Code:FT-U-LHB)

BI-RADS CATEGORY  1: Negative.
